# Patient Record
Sex: Male | Born: 1980 | Race: Black or African American | Hispanic: No | Marital: Single | Smoking: Former smoker
Health system: Southern US, Community
[De-identification: ages and names within clinical notes are randomized; demographics above are authoritative.]

## PROBLEM LIST (undated history)

## (undated) DIAGNOSIS — F419 Anxiety disorder, unspecified: Secondary | ICD-10-CM

## (undated) DIAGNOSIS — N183 Chronic kidney disease, stage 3 unspecified: Secondary | ICD-10-CM

## (undated) DIAGNOSIS — I639 Cerebral infarction, unspecified: Secondary | ICD-10-CM

## (undated) DIAGNOSIS — I517 Cardiomegaly: Secondary | ICD-10-CM

## (undated) DIAGNOSIS — F1210 Cannabis abuse, uncomplicated: Secondary | ICD-10-CM

## (undated) DIAGNOSIS — I10 Essential (primary) hypertension: Secondary | ICD-10-CM

## (undated) HISTORY — PX: NO PAST SURGERIES: SHX2092

---

## 2006-08-05 ENCOUNTER — Emergency Department: Payer: Self-pay | Admitting: Emergency Medicine
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
## 2013-04-02 ENCOUNTER — Emergency Department: Payer: Self-pay | Admitting: Emergency Medicine

## 2014-04-23 ENCOUNTER — Emergency Department: Payer: Self-pay | Admitting: Emergency Medicine

## 2014-04-24 LAB — BASIC METABOLIC PANEL
Anion Gap: 6 — ABNORMAL LOW (ref 7–16)
BUN: 14 mg/dL (ref 7–18)
CALCIUM: 9.5 mg/dL (ref 8.5–10.1)
CO2: 30 mmol/L (ref 21–32)
CREATININE: 1.36 mg/dL — AB (ref 0.60–1.30)
Chloride: 100 mmol/L (ref 98–107)
EGFR (African American): >60
EGFR (Non-African Amer.): >60
Glucose: 97 mg/dL (ref 65–99)
OSMOLALITY: 272 (ref 275–301)
Potassium: 3.8 mmol/L (ref 3.5–5.1)
Sodium: 136 mmol/L (ref 136–145)

## 2014-04-26 LAB — BETA STREP CULTURE(ARMC)

## 2015-01-05 ENCOUNTER — Emergency Department: Payer: Self-pay | Admitting: Student

## 2015-01-05 IMAGING — CR DG CHEST 2V
1 series · 2 of 2 positions shown · non-contrast
Comparison: [DATE]

CLINICAL DATA: Left-sided chest pain for 4 weeks, nonradiating

EXAM:
CHEST  2 VIEW

[Series 1: dxr chest pa (or ap) and lateral · 0.14mm/px · 2 of 2 slices shown]
[im 1/2]
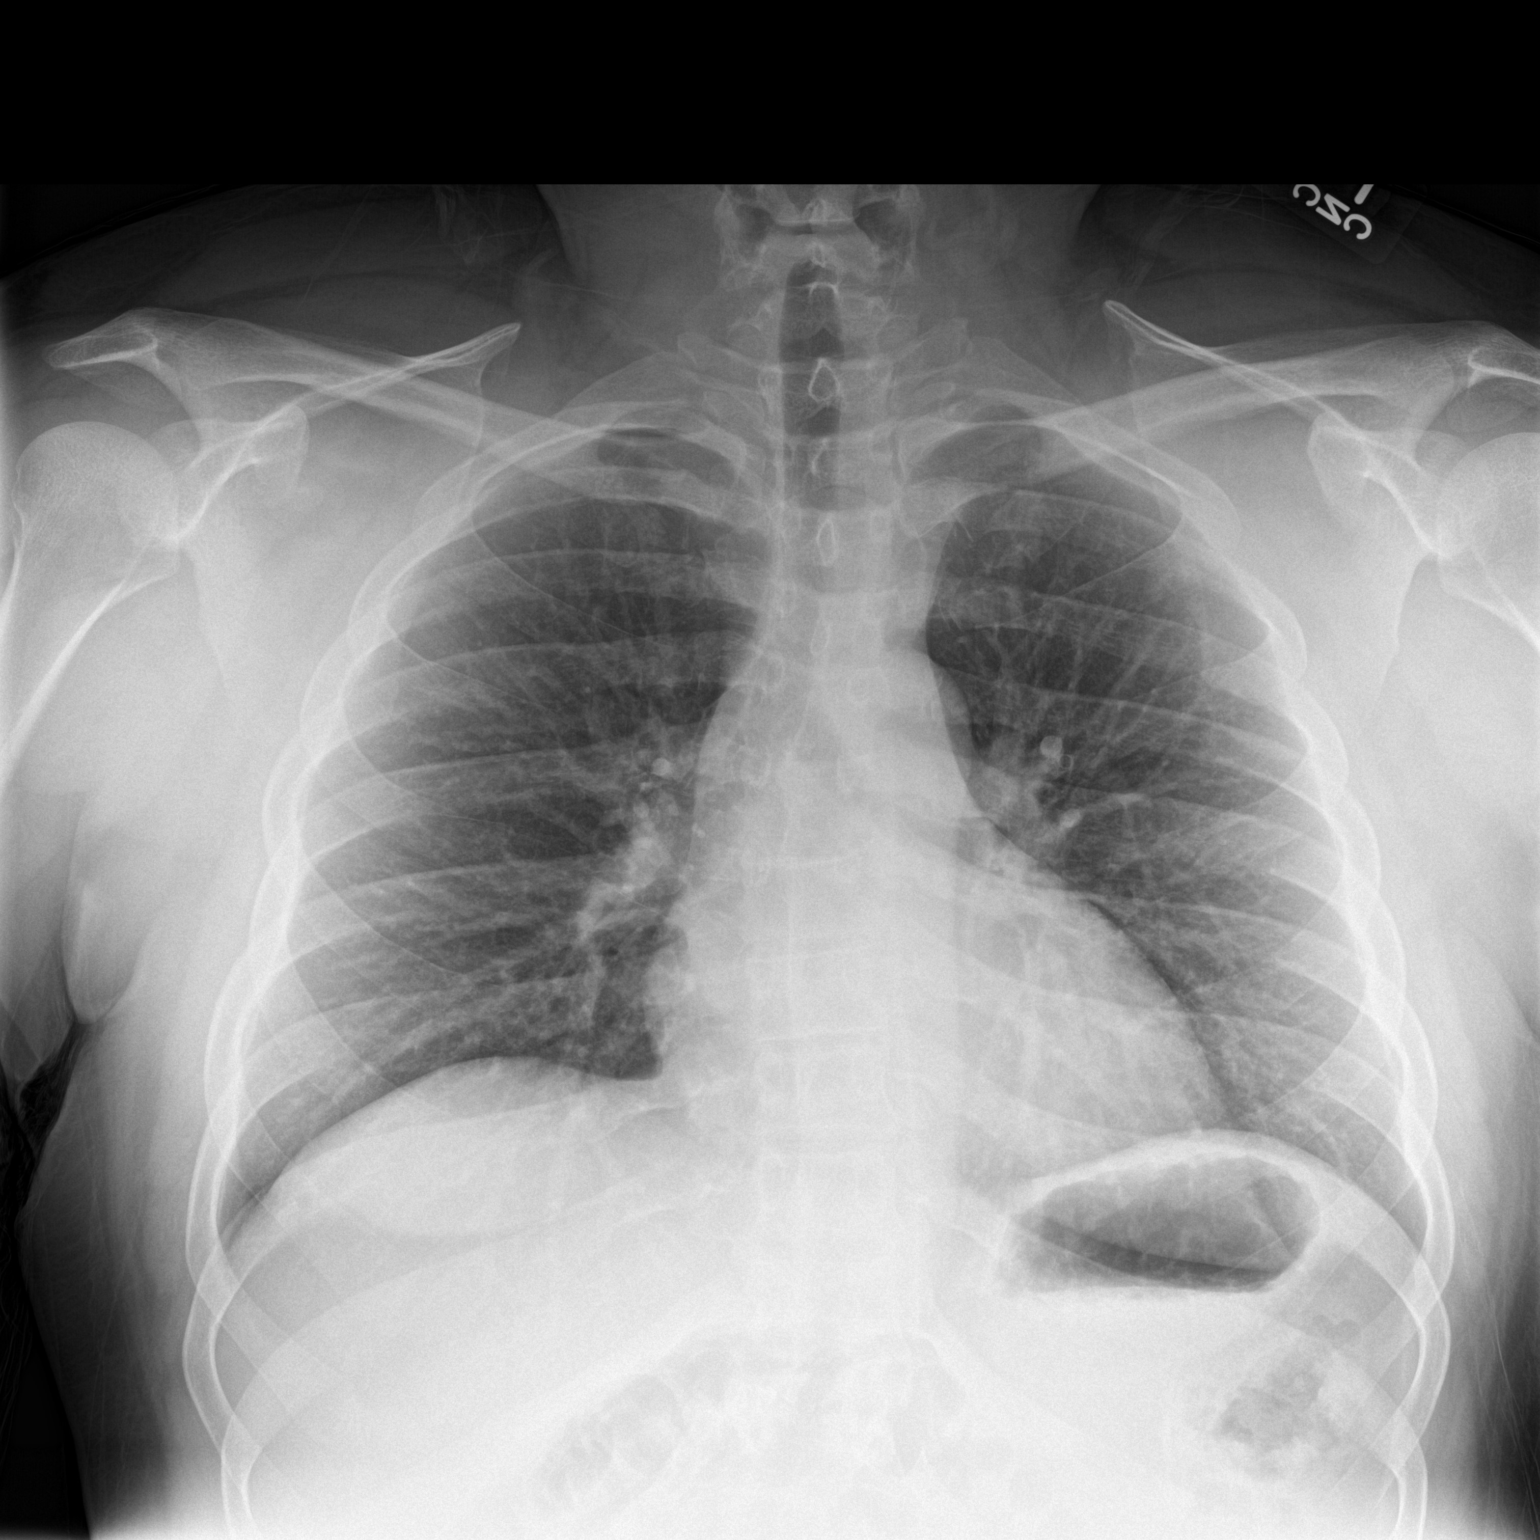
[im 2/2]
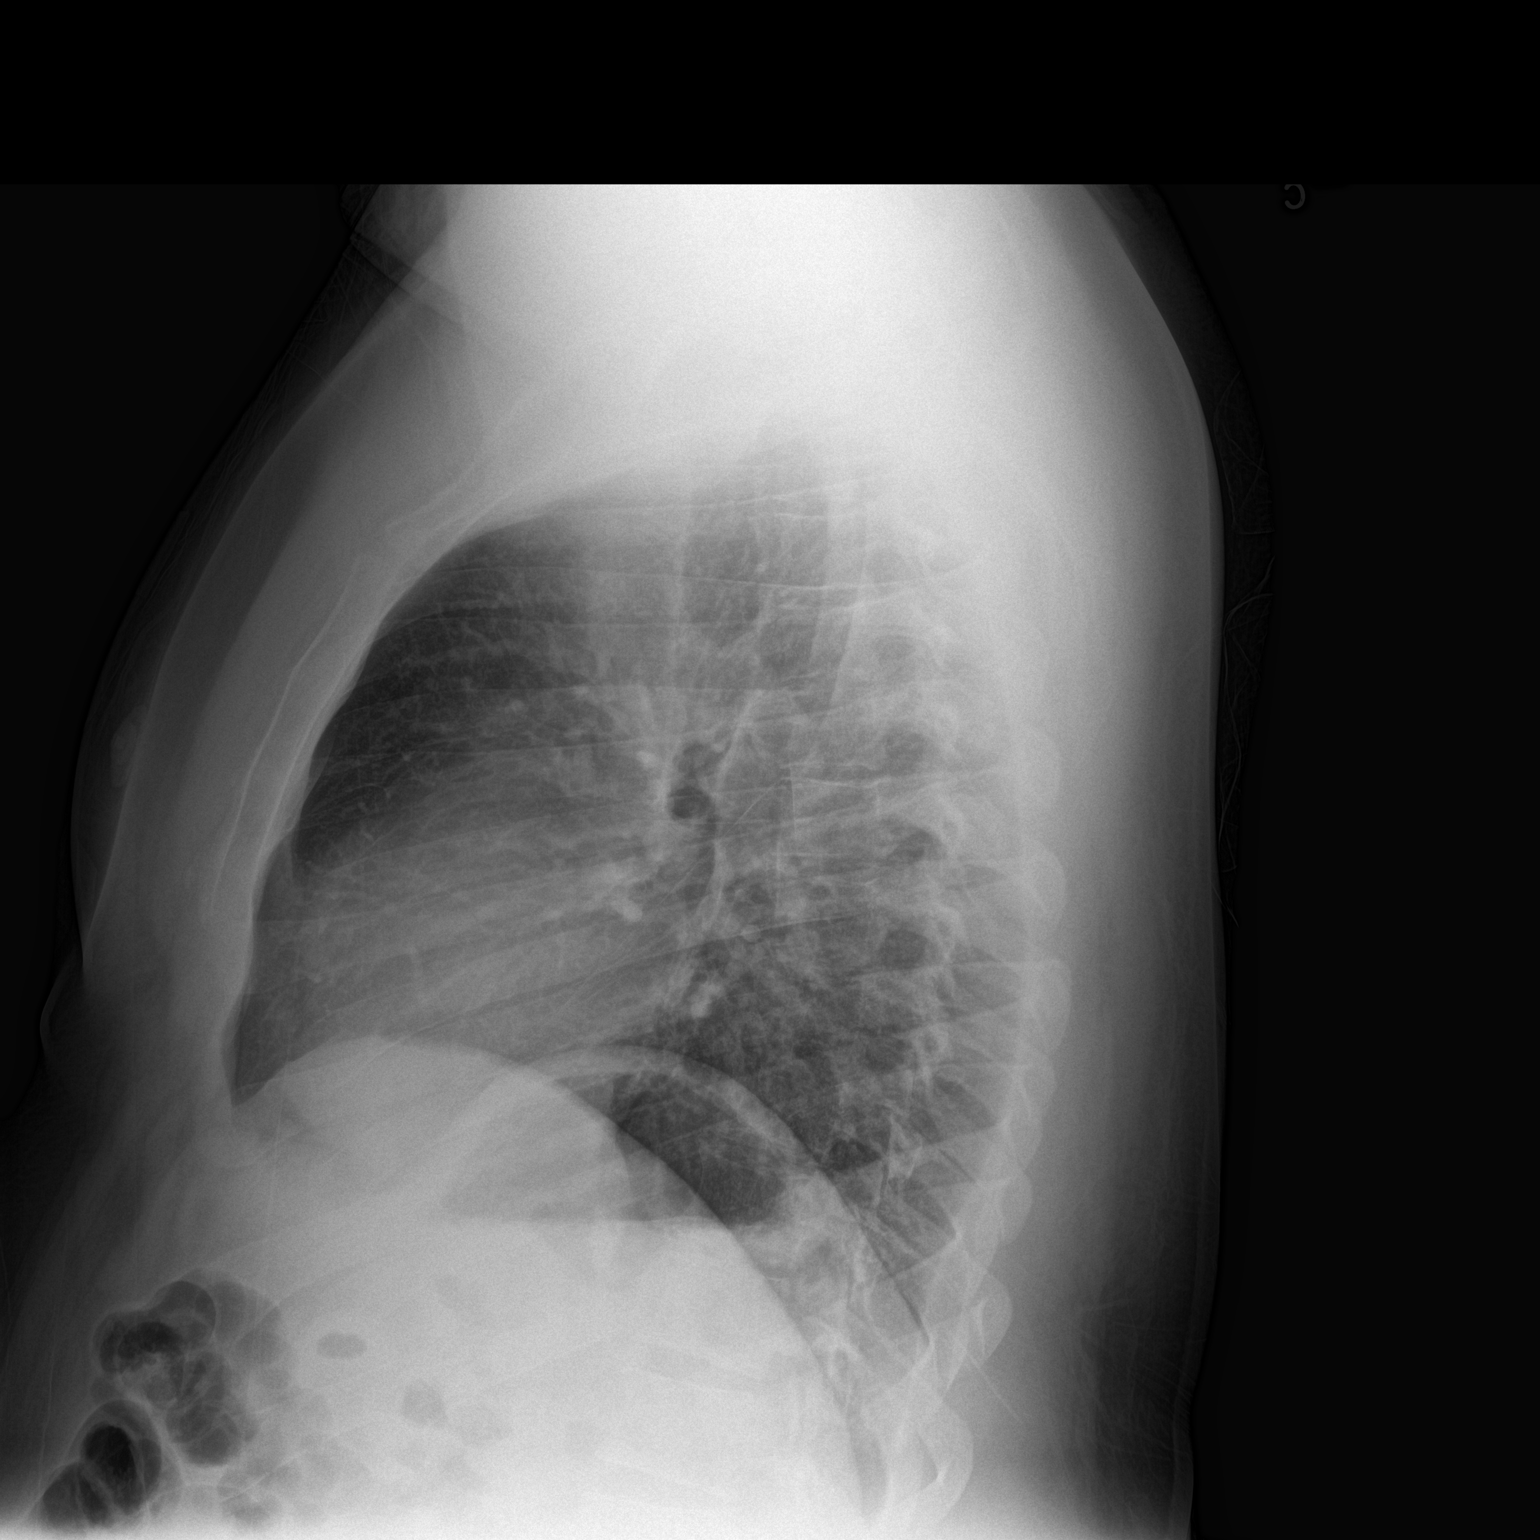

[2 of 2 positions shown; findings below may reference images not displayed]

FINDINGS: The lungs are clear. Heart size and pulmonary vascularity are
normal. No pneumothorax. No adenopathy. No bone lesions.
IMPRESSION: No edema or consolidation.

## 2016-08-26 ENCOUNTER — Emergency Department
Admission: EM | Admit: 2016-08-26 | Discharge: 2016-08-26 | Disposition: A | Discharge status: Home / Self Care | Payer: Self-pay | Attending: Emergency Medicine | Admitting: Emergency Medicine

## 2016-08-26 DIAGNOSIS — I10 Essential (primary) hypertension: Secondary | ICD-10-CM | POA: Insufficient documentation

## 2016-08-26 DIAGNOSIS — H16133 Photokeratitis, bilateral: Secondary | ICD-10-CM | POA: Insufficient documentation

## 2016-08-26 MED ORDER — ONDANSETRON 4 MG PO TBDP
4.0000 mg | ORAL_TABLET | Freq: Once | ORAL | Status: AC
  Administered 2016-08-26: 4 mg via ORAL
  Filled 2016-08-26: qty 1

## 2016-08-26 MED ORDER — OXYCODONE-ACETAMINOPHEN 5-325 MG PO TABS
1.0000 | ORAL_TABLET | Freq: Once | ORAL | Status: AC
  Administered 2016-08-26: 1 via ORAL
  Filled 2016-08-26: qty 1

## 2016-08-26 MED ORDER — TETRACAINE HCL 0.5 % OP SOLN
1.0000 [drp] | Freq: Once | OPHTHALMIC | Status: AC
  Administered 2016-08-26: 1 [drp] via OPHTHALMIC

## 2016-08-26 MED ORDER — HYDROCHLOROTHIAZIDE 50 MG PO TABS: 50.0000 mg | ORAL_TABLET | Freq: Every day | ORAL | 0 refills | Status: DC

## 2016-08-26 MED ORDER — IBUPROFEN 800 MG PO TABS
800.0000 mg | ORAL_TABLET | Freq: Once | ORAL | Status: AC
Start: 2016-08-26 — End: 2016-08-26
  Administered 2016-08-26: 800 mg via ORAL
  Filled 2016-08-26: qty 1

## 2016-08-26 MED ORDER — OXYCODONE-ACETAMINOPHEN 5-325 MG PO TABS: 1.0000 | ORAL_TABLET | ORAL | 0 refills | Status: DC | PRN

## 2016-08-26 MED ORDER — FLUORESCEIN SODIUM 1 MG OP STRP
ORAL_STRIP | OPHTHALMIC | Status: AC
  Administered 2016-08-26: 1 via OPHTHALMIC
  Filled 2016-08-26: qty 1

## 2016-08-26 MED ORDER — IBUPROFEN 800 MG PO TABS: 800.0000 mg | ORAL_TABLET | Freq: Three times a day (TID) | ORAL | 0 refills | Status: DC | PRN

## 2016-08-26 MED ORDER — TETRACAINE HCL 0.5 % OP SOLN
OPHTHALMIC | Status: AC
  Administered 2016-08-26: 1 [drp] via OPHTHALMIC
  Filled 2016-08-26: qty 2

## 2016-08-26 MED ORDER — TOBRAMYCIN 0.3 % OP SOLN
2.0000 [drp] | OPHTHALMIC | Status: DC
  Administered 2016-08-26: 2 [drp] via OPHTHALMIC
  Filled 2016-08-26: qty 5

## 2016-08-26 MED ORDER — TOBRAMYCIN 0.3 % OP SOLN: 2.0000 [drp] | OPHTHALMIC | 2 refills | Status: AC

## 2016-08-26 MED ORDER — FLUORESCEIN SODIUM 1 MG OP STRP
1.0000 | ORAL_STRIP | Freq: Once | OPHTHALMIC | Status: AC
  Administered 2016-08-26: 1 via OPHTHALMIC

## 2016-08-26 NOTE — ED Provider Notes (Signed)
[facility] Emergency Department Provider Note   ____________________________________________   First MD Initiated Contact with Patient 08/26/16 0222     (approximate)  I have reviewed the triage vital signs and the nursing notes.   HISTORY  Chief Complaint Burning Eyes    HPI Jeremy Hudson is a 35 y.o. male who presents to the ED from home with a chief complain of bilateral eyes burning and redness. 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 Normally does not wear corrective lenses. Awoke prior to arrival with irritation, redness and tearing to bilateral eyes. Elevated blood pressure at triage. States he is supposed to be on blood pressure medicines but he ran out several months ago. Denies associated fever, chills, chest pain, shortness of breath, abdominal pain, nausea, vomiting, diarrhea. Denies recent travel or trauma. Nothing makes his symptoms better or worse.   Past medical history Hypertension  There are no active problems to display for this patient.   No past surgical history on file.  Prior to Admission medications   Not on File    Allergies Review of patient's allergies indicates no known allergies.  No family history on file.  Social History Social History  Substance Use Topics  . Smoking status: Not on file  . Smokeless tobacco: Not on file  . Alcohol use Not on file  Denies EtOH  Review of Systems  Constitutional: No fever/chills. Eyes: Positive for bilateral eye irritation, redness and blurred vision. ENT: No sore throat. Cardiovascular: Denies chest pain. Respiratory: Denies shortness of breath. Gastrointestinal: No abdominal pain.  No nausea, no vomiting.  No diarrhea.  No constipation. Genitourinary: Negative for dysuria. Musculoskeletal: Negative for back pain. Skin: Negative for rash. Neurological: Negative for headaches, focal weakness or  numbness.  10-point ROS otherwise negative.  ____________________________________________   PHYSICAL EXAM:  VITAL SIGNS: ED Triage Vitals  Enc Vitals Group     BP 08/26/16 0143 (!) 252/159     Pulse Rate 08/26/16 0141 97     Resp 08/26/16 0141 18     Temp 08/26/16 0141 98.2 F (36.8 C)     Temp Source 08/26/16 0141 Oral     SpO2 08/26/16 0141 97 %     Weight 08/26/16 0142 230 lb (104.3 kg)     Height 08/26/16 0142 5\' 4"  (1.626 m)     Head Circumference --      Peak Flow --      Pain Score 08/26/16 0142 8     Pain Loc --      Pain Edu? --      Excl. in GC? --     Constitutional: Alert and oriented. Well appearing and in mild acute distress. Eyes: Conjunctivae are reddened bilaterally. PERRL. EOMI. visual acuity noted. Tetracaine applied to bilateral eyes. Fluorescein strip applied to both eyes and visualized under was lamp. There is a linear band across both corneas consistent with UV keratitis. Head: Atraumatic. Nose: No congestion/rhinnorhea. Mouth/Throat: Mucous membranes are moist.  Oropharynx non-erythematous. Neck: No stridor.   Cardiovascular: Normal rate, regular rhythm. Grossly normal heart sounds.  Good peripheral circulation. Respiratory: Normal respiratory effort.  No retractions. Lungs CTAB. Gastrointestinal: Soft and nontender. No distention. No abdominal bruits. No CVA tenderness. Musculoskeletal: No lower extremity tenderness nor edema.  No joint effusions. Neurologic:  Normal speech and language. No gross focal neurologic deficits are appreciated. No gait instability. Skin:  Skin is warm, dry and intact.  No rash noted. Psychiatric: Mood and affect are normal. Speech and behavior are normal.  ____________________________________________   LABS (all labs ordered are listed, but only abnormal results are displayed)  Labs Reviewed - No data to  display ____________________________________________  EKG  None ____________________________________________  RADIOLOGY  None ____________________________________________   PROCEDURES  Procedure(s) performed: None  Procedures  Critical Care performed: No  ____________________________________________   INITIAL IMPRESSION / ASSESSMENT AND PLAN / ED COURSE  Pertinent labs & imaging results that were available during my care of the patient were reviewed by me and considered in my medical decision making (see chart for details).  35 year old male who presents with UV keratitis to both eyes. Will administer analgesia, Tobrex eyedrops and patient will follow-up with ophthalmology this week. Will start patient on HCTZ for asymptomatic hypertension and patient will follow-up with PCP closely. Strict return precautions given. Both patient and mother verbalize understanding and agree with plan of care.  Clinical Course     ____________________________________________   FINAL CLINICAL IMPRESSION(S) / ED DIAGNOSES  Final diagnoses:  UV keratitis, bilateral  Essential hypertension      NEW MEDICATIONS STARTED DURING THIS VISIT:  New Prescriptions   No medications on file     Note:  This document was prepared using Dragon voice recognition software and may include unintentional dictation errors.    Irean HongJade J Sung, MD 08/26/16 [phone]

## 2016-08-26 NOTE — ED Notes (Signed)
Pt states woke up with eyes burning felt like eye wouldn't open. Pt noted to have redness states vision is blurry. Pt has elevated BP. Pt not taking BP like he is suppose to. Family at the bdside. Pt denies any cp or sob.

## 2016-08-26 NOTE — ED Notes (Signed)
Visua acuity performed on patient. Right eye 20/50 and left eye 20/40. MD notified.

## 2016-08-26 NOTE — ED Notes (Signed)
MD aware of bp

## 2016-08-26 NOTE — Discharge Instructions (Signed)
1. Apply Tobrex eyedrops 2 drops to each eye every 4 hours while awake 7 days. 2. You may take pain medicines as needed (Motrin/Percocet). 3. Start blood pressure medicine daily as prescribed (HCTZ 50 mg). 4. Return to the ER for worsening symptoms, persistent vomiting, difficulty breathing or other concerns.

## 2016-08-26 NOTE — ED Notes (Signed)
Discharge instructions reviewed with patient. Questions fielded by this RN. Patient verbalizes understanding of instructions. Patient discharged home in stable condition per Dolores FrameSung MD .  Dr Dolores FrameSung aware of BP beofre discharge. No acute distress noted at time of discharge.

## 2016-08-26 NOTE — ED Triage Notes (Signed)
Pt states he woke up with eyes burning and red. 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

## 2017-09-13 ENCOUNTER — Emergency Department
Admission: EM | Admit: 2017-09-13 | Discharge: 2017-09-13 | Disposition: A | Discharge status: Home / Self Care | Payer: Self-pay | Attending: Emergency Medicine | Admitting: Emergency Medicine

## 2017-09-13 ENCOUNTER — Emergency Department: Payer: Self-pay

## 2017-09-13 ENCOUNTER — Other Ambulatory Visit: Payer: Self-pay

## 2017-09-13 DIAGNOSIS — S0181XA Laceration without foreign body of other part of head, initial encounter: Secondary | ICD-10-CM | POA: Insufficient documentation

## 2017-09-13 DIAGNOSIS — Y929 Unspecified place or not applicable: Secondary | ICD-10-CM | POA: Insufficient documentation

## 2017-09-13 DIAGNOSIS — Y93E1 Activity, personal bathing and showering: Secondary | ICD-10-CM | POA: Insufficient documentation

## 2017-09-13 DIAGNOSIS — Z79899 Other long term (current) drug therapy: Secondary | ICD-10-CM | POA: Insufficient documentation

## 2017-09-13 DIAGNOSIS — R55 Syncope and collapse: Secondary | ICD-10-CM

## 2017-09-13 DIAGNOSIS — I10 Essential (primary) hypertension: Secondary | ICD-10-CM | POA: Insufficient documentation

## 2017-09-13 DIAGNOSIS — Y999 Unspecified external cause status: Secondary | ICD-10-CM | POA: Insufficient documentation

## 2017-09-13 DIAGNOSIS — W010XXA Fall on same level from slipping, tripping and stumbling without subsequent striking against object, initial encounter: Secondary | ICD-10-CM | POA: Insufficient documentation

## 2017-09-13 LAB — BASIC METABOLIC PANEL
ANION GAP: 10 (ref 5–15)
BUN: 15 mg/dL (ref 6–20)
CALCIUM: 9.2 mg/dL (ref 8.9–10.3)
CO2: 25 mmol/L (ref 22–32)
Chloride: 101 mmol/L (ref 101–111)
Creatinine, Ser: 1.41 mg/dL — ABNORMAL HIGH (ref 0.61–1.24)
Glucose, Bld: 126 mg/dL — ABNORMAL HIGH (ref 65–99)
Potassium: 3.6 mmol/L (ref 3.5–5.1)
SODIUM: 136 mmol/L (ref 135–145)

## 2017-09-13 LAB — CBC
HCT: 43.5 % (ref 40.0–52.0)
HEMOGLOBIN: 14 g/dL (ref 13.0–18.0)
MCH: 26.6 pg (ref 26.0–34.0)
MCHC: 32.3 g/dL (ref 32.0–36.0)
MCV: 82.3 fL (ref 80.0–100.0)
Platelets: 361 10*3/uL (ref 150–440)
RBC: 5.29 MIL/uL (ref 4.40–5.90)
RDW: 15 % — ABNORMAL HIGH (ref 11.5–14.5)
WBC: 11.5 10*3/uL — AB (ref 3.8–10.6)

## 2017-09-13 LAB — GLUCOSE, CAPILLARY: GLUCOSE-CAPILLARY: 120 mg/dL — AB (ref 65–99)

## 2017-09-13 MED ORDER — LIDOCAINE-EPINEPHRINE 1 %-1:100000 IJ SOLN
10.0000 mL | Freq: Once | INTRAMUSCULAR | Status: AC
  Administered 2017-09-13: 10 mL via INTRADERMAL
  Filled 2017-09-13: qty 10

## 2017-09-13 NOTE — Discharge Instructions (Signed)
Today I put 8 stitches in your right eyebrow that need to come out in 5-7 days.  Any doctor can do this including your primary care doctor, in urgent care, or of course we are more than happy to see you.  Please do not get any water on your wound for the first 24 hours but after that keep it clean and dry with warm soapy water in the shower.  Return to the emergency department for any concerns whatsoever.  It was a pleasure to take care of you today, and thank you for coming to our emergency department.  If you have any questions or concerns before leaving please ask the nurse to grab me and I'm more than happy to go through your aftercare instructions again.  If you were prescribed any opioid pain medication today such as Norco, Vicodin, Percocet, morphine, hydrocodone, or oxycodone please make sure you do not drive when you are taking this medication as it can alter your ability to drive safely.  If you have any concerns once you are home that you are not improving or are in fact getting worse before you can make it to your follow-up appointment, please do not hesitate to call [phone] and come back for further evaluation.  Merrily BrittleNeil Marcha Licklider, MD  Results for orders placed or performed during the hospital encounter of 09/13/17  Basic metabolic panel  Result Value Ref Range   Sodium 136 135 - 145 mmol/L   Potassium 3.6 3.5 - 5.1 mmol/L   Chloride 101 101 - 111 mmol/L   CO2 25 22 - 32 mmol/L   Glucose, Bld 126 (H) 65 - 99 mg/dL   BUN 15 6 - 20 mg/dL   Creatinine, Ser 0.451.41 (H) 0.61 - 1.24 mg/dL   Calcium 9.2 8.9 - 40.910.3 mg/dL   GFR calc non Af Amer >60 >60 mL/min   GFR calc Af Amer >60 >60 mL/min   Anion gap 10 5 - 15  CBC  Result Value Ref Range   WBC 11.5 (H) 3.8 - 10.6 K/uL   RBC 5.29 4.40 - 5.90 MIL/uL   Hemoglobin 14.0 13.0 - 18.0 g/dL   HCT 81.143.5 91.440.0 - 78.252.0 %   MCV 82.3 80.0 - 100.0 fL   MCH 26.6 26.0 - 34.0 pg   MCHC 32.3 32.0 - 36.0 g/dL   RDW 95.615.0 (H) 21.311.5 - 08.614.5 %   Platelets 361 150  - 440 K/uL  Glucose, capillary  Result Value Ref Range   Glucose-Capillary 120 (H) 65 - 99 mg/dL   No results found.

## 2017-09-13 NOTE — ED Provider Notes (Signed)
[facility] Emergency Department Provider Note  ____________________________________________   First MD Initiated Contact with Patient 09/13/17 1322     (approximate)  I have reviewed the triage vital signs and the nursing notes.   HISTORY  Chief Complaint Loss of Consciousness   HPI Jeremy Hudson is a 36 y.o. male who self presents to the emergency department with right facial trauma following a syncopal event.  He said that he was taking a hot shower this morning and he began to masturbate.  When he had an orgasm he felt lightheaded and the next thing he knew he woke up on the ground bleeding from his face.  His tetanus is up-to-date.  He has one previous syncopal episode in his life following a sexual experience.  He denies antecedent chest pain, palpitations, shortness of breath.  Symptoms began suddenly quickly.  They are worsened with orgasm and improved at rest.  No past medical history on file.  There are no active problems to display for this patient.   No past surgical history on file.  Prior to Admission medications   Medication Sig Start Date End Date Taking? Authorizing Provider  hydrochlorothiazide (HYDRODIURIL) 50 MG tablet Take 1 tablet (50 mg total) by mouth daily. 08/26/16   Irean HongSung, Jade J, MD  ibuprofen (ADVIL,MOTRIN) 800 MG tablet Take 1 tablet (800 mg total) by mouth every 8 (eight) hours as needed for moderate pain. 08/26/16   Irean HongSung, Jade J, MD  oxyCODONE-acetaminophen (ROXICET) 5-325 MG tablet Take 1 tablet by mouth every 4 (four) hours as needed for severe pain. 08/26/16   Irean HongSung, Jade J, MD    Allergies Patient has no known allergies.  No family history on file.  Social History Social History   Tobacco Use  . Smoking status: Not on file  Substance Use Topics  . Alcohol use: Not on file  . Drug use: Not on file    Review of Systems Constitutional: No fever/chills Eyes: No visual changes. ENT: No sore  throat. Cardiovascular: Denies chest pain. Respiratory: Denies shortness of breath. Gastrointestinal: No abdominal pain.  No nausea, no vomiting.  No diarrhea.  No constipation. Genitourinary: Negative for dysuria. Musculoskeletal: Negative for back pain. Skin: Positive for wound Neurological: Negative for headaches, focal weakness or numbness.   ____________________________________________   PHYSICAL EXAM:  VITAL SIGNS: ED Triage Vitals  Enc Vitals Group     BP 09/13/17 1250 (!) 217/121     Pulse Rate 09/13/17 1250 68     Resp 09/13/17 1250 18     Temp 09/13/17 1250 98.2 F (36.8 C)     Temp Source 09/13/17 1250 Oral     SpO2 09/13/17 1250 100 %     Weight 09/13/17 1250 213 lb (96.6 kg)     Height 09/13/17 1250 5\' 6"  (1.676 m)     Head Circumference --      Peak Flow --      Pain Score 09/13/17 1249 8     Pain Loc --      Pain Edu? --      Excl. in GC? --     Constitutional: Alert and oriented x4 well-appearing nontoxic no diaphoresis speaks in full clear sentences Eyes: PERRL EOMI. Head: 6 cm laceration to right brow. Nose: No congestion/rhinnorhea. Mouth/Throat: No trismus Neck: No stridor.   Cardiovascular: Normal rate, regular rhythm. Grossly normal heart sounds.  Good peripheral circulation. Respiratory: Normal respiratory effort.  No retractions. Lungs CTAB and moving good air  Gastrointestinal: Soft nontender Musculoskeletal: No lower extremity edema   Neurologic:  Normal speech and language. No gross focal neurologic deficits are appreciated. Skin: 6 cm laceration to right brow quite deep Psychiatric: Mood and affect are normal. Speech and behavior are normal.    ____________________________________________   DIFFERENTIAL includes but not limited to  Vasovagal syncope, cardiogenic syncope, laceration, intracerebral hemorrhage ____________________________________________   LABS (all labs ordered are listed, but only abnormal results are  displayed)  Labs Reviewed  BASIC METABOLIC PANEL - Abnormal; Notable for the following components:      Result Value   Glucose, Bld 126 (*)    Creatinine, Ser 1.41 (*)    All other components within normal limits  CBC - Abnormal; Notable for the following components:   WBC 11.5 (*)    RDW 15.0 (*)    All other components within normal limits  GLUCOSE, CAPILLARY - Abnormal; Notable for the following components:   Glucose-Capillary 120 (*)    All other components within normal limits  URINALYSIS, COMPLETE (UACMP) WITH MICROSCOPIC  CBG MONITORING, ED    Blood work reviewed and interpreted by me shows elevated white counts nonspecific and likely secondary to stress.  Slightly elevated creatinine consistent with chronic kidney disease __________________________________________  EKG  ED ECG REPORT I, Merrily BrittleNeil Pranay Hilbun, the attending physician, personally viewed and interpreted this ECG.  Date: 09/13/2017 EKG Time:  Rate: 69 Rhythm: normal sinus rhythm QRS Axis: normal Intervals: normal ST/T Wave abnormalities: normal Narrative Interpretation: no evidence of acute ischemia.  Large voltage is slightly poor R wave progression __________________________________________  RADIOLOGY   ____________________________________________   PROCEDURES  Procedure(s) performed: yes  LACERATION REPAIR Performed by: Merrily BrittleNeil Couper Juncaj Authorized by: Merrily BrittleNeil Torre [place] Consent: Verbal consent obtained. Risks and benefits: risks, benefits and alternatives were discussed Consent given by: patient Patient identity confirmed: provided demographic data Prepped and Draped in normal sterile fashion Wound explored  Laceration Location: Right eyebrow  Laceration Length: 6 cm  No Foreign Bodies seen or palpated  Anesthesia: local infiltration  Local anesthetic: lidocaine 1 % with epinephrine  Anesthetic total: 4 ml  Irrigation method: syringe Amount of cleaning: standard  Skin closure: Skin  closed in 2 layers First layer a total of 4 5-0 Vicryl sutures Superficial layer a total of 8 6-0 proline sutures  Number of sutures: 12 as above  Technique: Simple interrupted in 2 layers  Patient tolerance: Patient tolerated the procedure well with no immediate complications.  The patient's wound was anesthetized and then irrigated with 500 cc of normal saline suture.  No foreign bodies identified.   Procedures  Critical Care performed: no  Observation: no ____________________________________________   INITIAL IMPRESSION / ASSESSMENT AND PLAN / ED COURSE  Pertinent labs & imaging results that were available during my care of the patient were reviewed by me and considered in my medical decision making (see chart for details).  The patient arrives after a syncopal episode in the shower after masturbating.  Low suspicion for cardiogenic syncope.  His wound was irrigated and repaired in 2 layers with good cosmesis.  Strict return precautions have been given and patient verbalized understanding and agreement with the plan.  I appreciate the patient's elevated blood pressure but he already has blood pressure medications and a primary care physician.  He reports noncompliance with his medications because he does not like with him he can feel.  I expressed to him the importance of keeping his blood pressure under control and he verbalized understanding.  ____________________________________________   FINAL CLINICAL IMPRESSION(S) / ED DIAGNOSES  Final diagnoses:  Facial laceration, initial encounter  Vasovagal syncope  Hypertension, unspecified type      NEW MEDICATIONS STARTED DURING THIS VISIT:  This SmartLink is deprecated. Use AVSMEDLIST instead to display the medication list for a patient.   Note:  This document was prepared using Dragon voice recognition software and may include unintentional dictation errors.     Merrily Brittle, MD 09/13/17 [phone]

## 2017-09-13 NOTE — ED Notes (Signed)
Pt discharged to home.  Family member driving.  Discharge instructions reviewed.  Verbalized understanding.  No questions or concerns at this time.  Teach back verified.  Pt in NAD.  No items left in ED.   

## 2017-09-13 NOTE — ED Notes (Signed)
EDP aware of BP, okay for pt to d/c and f/u with PCP.

## 2017-09-13 NOTE — ED Triage Notes (Signed)
Pt states he was getting out of the shower and slipped and fell. Pt states he felt dizzy prior to coming out of the shower.  Pt states he did have LOC.  Pt has laceration on eye brow. Bleeding is controlled at this time.  Pt denies any dizziness, nausea or vomiting.
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
## 2018-02-21 ENCOUNTER — Inpatient Hospital Stay
Admission: EM | Admit: 2018-02-21 | Discharge: 2018-02-23 | DRG: 305 | Disposition: A | Discharge status: Home / Self Care | Payer: Self-pay | Attending: Internal Medicine | Admitting: Internal Medicine

## 2018-02-21 ENCOUNTER — Encounter: Payer: Self-pay | Admitting: Emergency Medicine

## 2018-02-21 ENCOUNTER — Other Ambulatory Visit: Payer: Self-pay

## 2018-02-21 ENCOUNTER — Emergency Department: Payer: Self-pay

## 2018-02-21 DIAGNOSIS — N179 Acute kidney failure, unspecified: Secondary | ICD-10-CM | POA: Diagnosis present

## 2018-02-21 DIAGNOSIS — I129 Hypertensive chronic kidney disease with stage 1 through stage 4 chronic kidney disease, or unspecified chronic kidney disease: Secondary | ICD-10-CM | POA: Diagnosis present

## 2018-02-21 DIAGNOSIS — Z79899 Other long term (current) drug therapy: Secondary | ICD-10-CM

## 2018-02-21 DIAGNOSIS — D72829 Elevated white blood cell count, unspecified: Secondary | ICD-10-CM | POA: Diagnosis present

## 2018-02-21 DIAGNOSIS — Z87891 Personal history of nicotine dependence: Secondary | ICD-10-CM

## 2018-02-21 DIAGNOSIS — I161 Hypertensive emergency: Principal | ICD-10-CM | POA: Diagnosis present

## 2018-02-21 DIAGNOSIS — N201 Calculus of ureter: Secondary | ICD-10-CM

## 2018-02-21 DIAGNOSIS — N132 Hydronephrosis with renal and ureteral calculous obstruction: Secondary | ICD-10-CM | POA: Diagnosis present

## 2018-02-21 DIAGNOSIS — N200 Calculus of kidney: Secondary | ICD-10-CM

## 2018-02-21 DIAGNOSIS — N183 Chronic kidney disease, stage 3 (moderate): Secondary | ICD-10-CM | POA: Diagnosis present

## 2018-02-21 DIAGNOSIS — I10 Essential (primary) hypertension: Secondary | ICD-10-CM | POA: Diagnosis present

## 2018-02-21 DIAGNOSIS — R112 Nausea with vomiting, unspecified: Secondary | ICD-10-CM | POA: Diagnosis present

## 2018-02-21 HISTORY — DX: Essential (primary) hypertension: I10

## 2018-02-21 LAB — LIPASE, BLOOD: LIPASE: 27 U/L (ref 11–51)

## 2018-02-21 LAB — COMPREHENSIVE METABOLIC PANEL
ALBUMIN: 4.9 g/dL (ref 3.5–5.0)
ALT: 46 U/L (ref 17–63)
AST: 27 U/L (ref 15–41)
Alkaline Phosphatase: 110 U/L (ref 38–126)
Anion gap: 10 (ref 5–15)
BILIRUBIN TOTAL: 1.1 mg/dL (ref 0.3–1.2)
BUN: 28 mg/dL — ABNORMAL HIGH (ref 6–20)
CO2: 25 mmol/L (ref 22–32)
Calcium: 9.3 mg/dL (ref 8.9–10.3)
Chloride: 101 mmol/L (ref 101–111)
Creatinine, Ser: 2.31 mg/dL — ABNORMAL HIGH (ref 0.61–1.24)
GFR calc Af Amer: 41 mL/min — ABNORMAL LOW (ref >60)
GFR, EST NON AFRICAN AMERICAN: 35 mL/min — AB (ref >60)
GLUCOSE: 120 mg/dL — AB (ref 65–99)
POTASSIUM: 3.7 mmol/L (ref 3.5–5.1)
Sodium: 136 mmol/L (ref 135–145)
TOTAL PROTEIN: 8.6 g/dL — AB (ref 6.5–8.1)

## 2018-02-21 LAB — URINALYSIS, COMPLETE (UACMP) WITH MICROSCOPIC
BILIRUBIN URINE: NEGATIVE
GLUCOSE, UA: NEGATIVE mg/dL
Ketones, ur: 20 mg/dL — AB
Leukocytes, UA: NEGATIVE
Nitrite: NEGATIVE
PH: 6 (ref 5.0–8.0)
Protein, ur: 100 mg/dL — AB
SPECIFIC GRAVITY, URINE: 1.018 (ref 1.005–1.030)

## 2018-02-21 LAB — CBC
HCT: 42.1 % (ref 40.0–52.0)
Hemoglobin: 13.7 g/dL (ref 13.0–18.0)
MCH: 27.5 pg (ref 26.0–34.0)
MCHC: 32.7 g/dL (ref 32.0–36.0)
MCV: 84.3 fL (ref 80.0–100.0)
PLATELETS: 354 10*3/uL (ref 150–440)
RBC: 4.99 MIL/uL (ref 4.40–5.90)
RDW: 15.3 % — AB (ref 11.5–14.5)
WBC: 17.4 10*3/uL — ABNORMAL HIGH (ref 3.8–10.6)

## 2018-02-21 LAB — TROPONIN I: Troponin I: <0.03 ng/mL (ref <0.03)

## 2018-02-21 IMAGING — CT CT ANGIO CHEST-ABD-PELV FOR DISSECTION W/ AND WO/W CM
2 of 7 series · 13 of 46 positions shown, 15 images · IV contrast (APPLIED)
Comparison: None.

CLINICAL DATA: Left-sided abdominal pain for several days, recent
episodes of vomiting with recent alcohol intake

EXAM:
CT ANGIOGRAPHY CHEST, ABDOMEN AND PELVIS
TECHNIQUE: Multidetector CT imaging through the chest, abdomen and pelvis was
performed using the standard protocol during bolus administration of
intravenous contrast. Multiplanar reconstructed images and MIPs were
obtained and reviewed to evaluate the vascular anatomy.
CONTRAST:  60mL OMNIPAQUE IOHEXOL 350 MG/ML SOLN

[Series 6: axial arterial · axial · arterial · 0.72mm/px · z∈[-710,-164]mm · 10 of 209 slices shown, 12 images]
[im 14/209  soft-tissue]
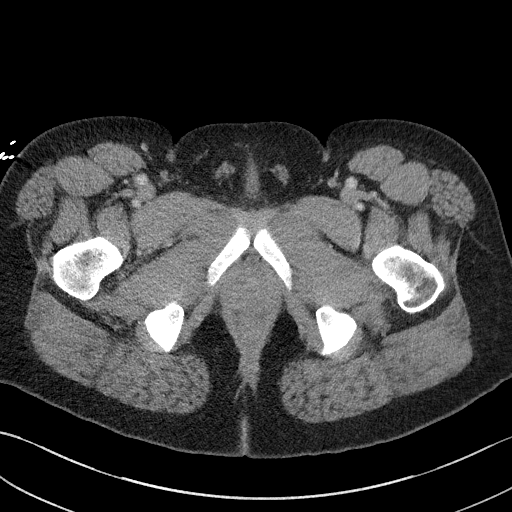
[im 14/209  bone]
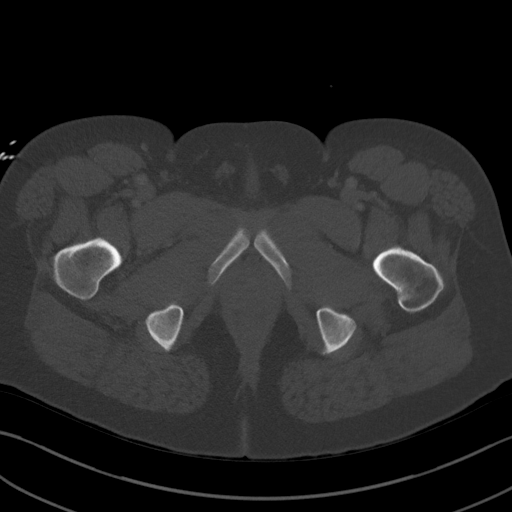
[im 40/209  soft-tissue]
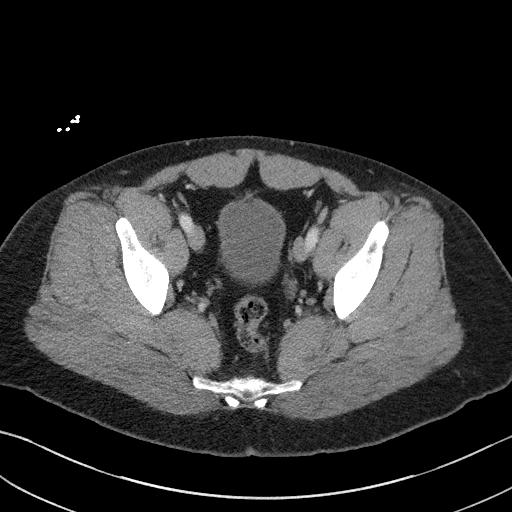
[im 53/209  soft-tissue]
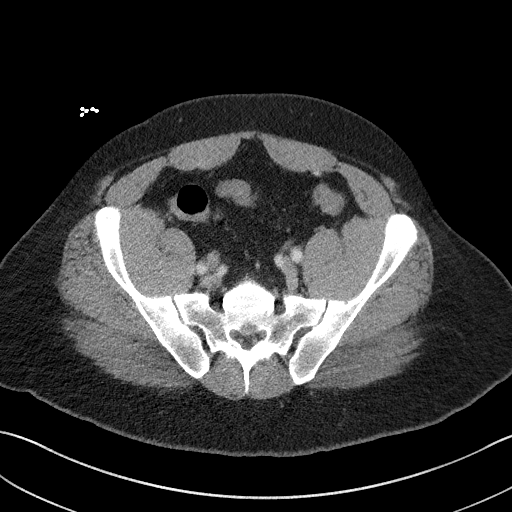
[im 79/209  soft-tissue]
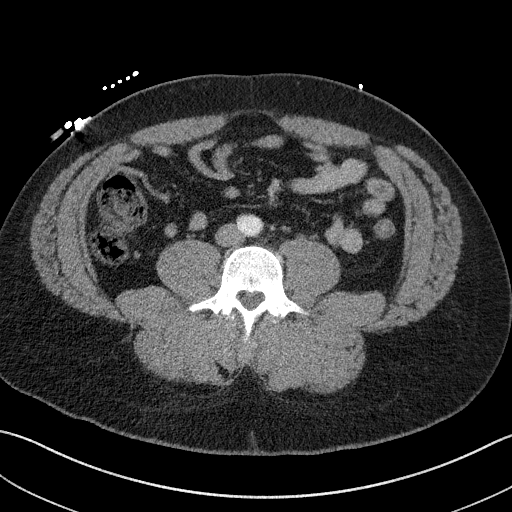
[im 92/209  soft-tissue]
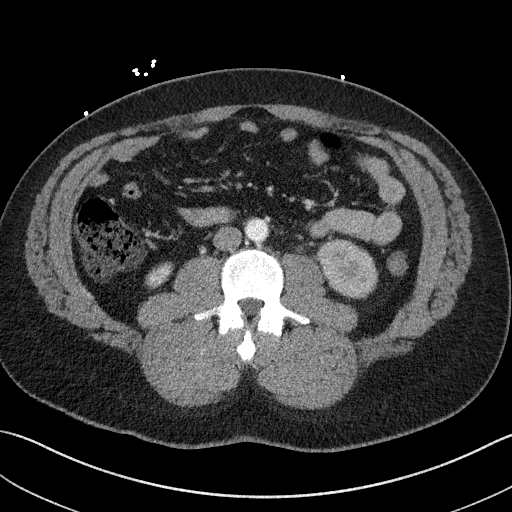
[im 118/209  soft-tissue]
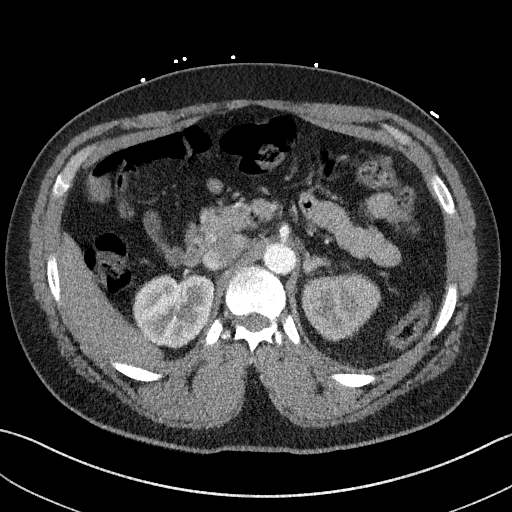
[im 131/209  soft-tissue]
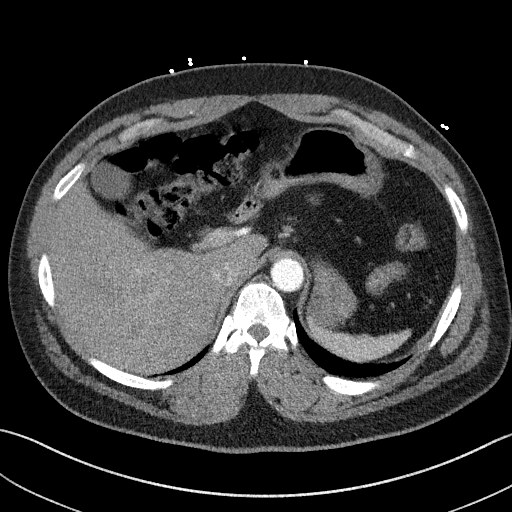
[im 157/209  soft-tissue]
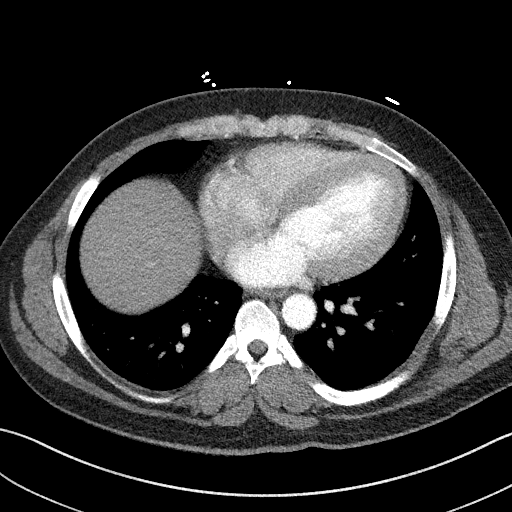
[im 170/209  soft-tissue]
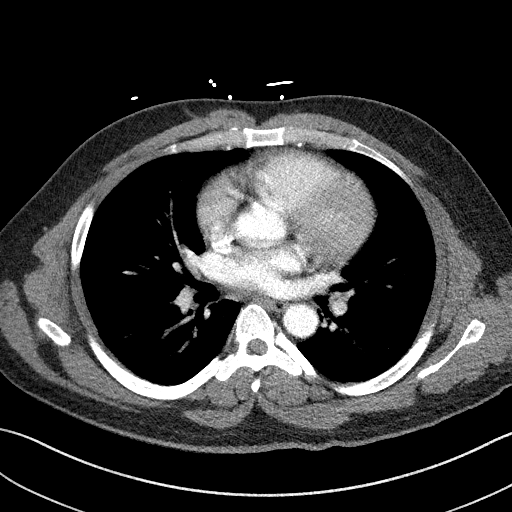
[im 170/209  bone]
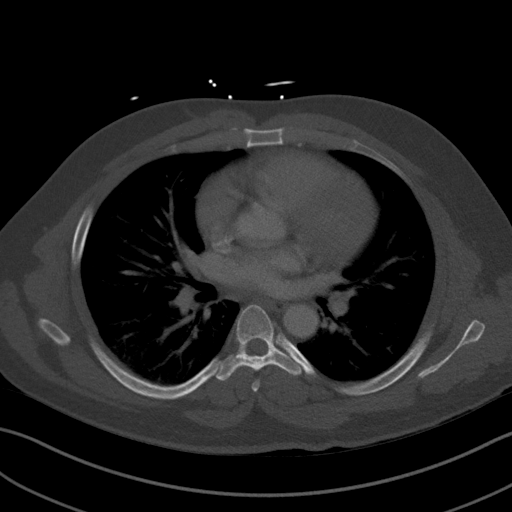
[im 196/209  soft-tissue]
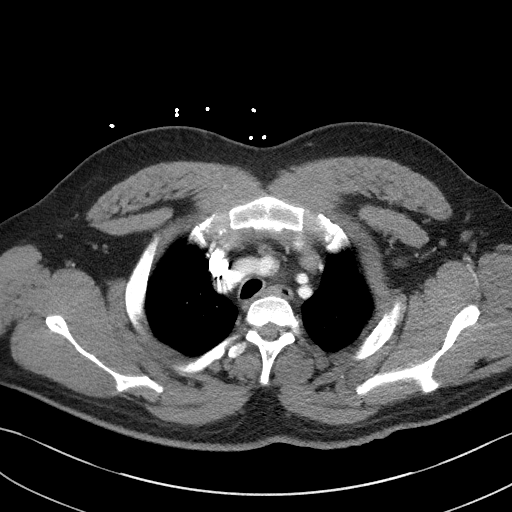

[Series 9: coronals · coronal · 0.74mm/px · 3 of 147 slices shown]
[im 37/147  soft-tissue]
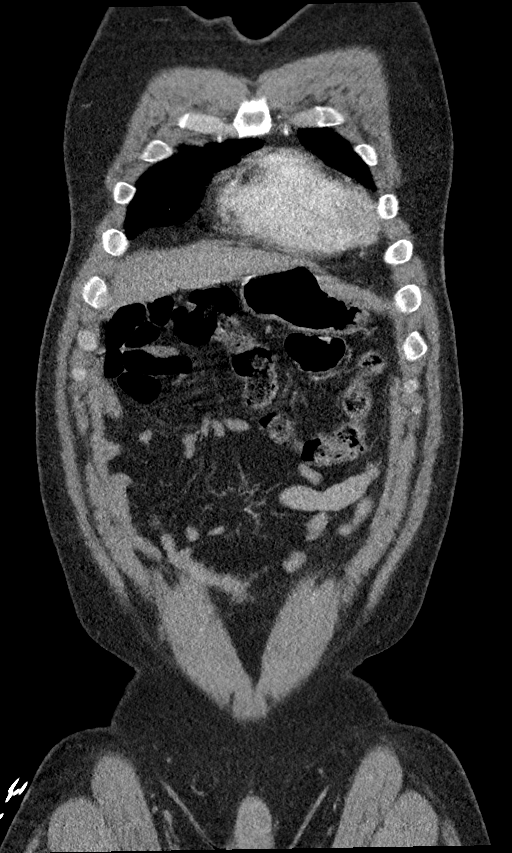
[im 74/147  soft-tissue]
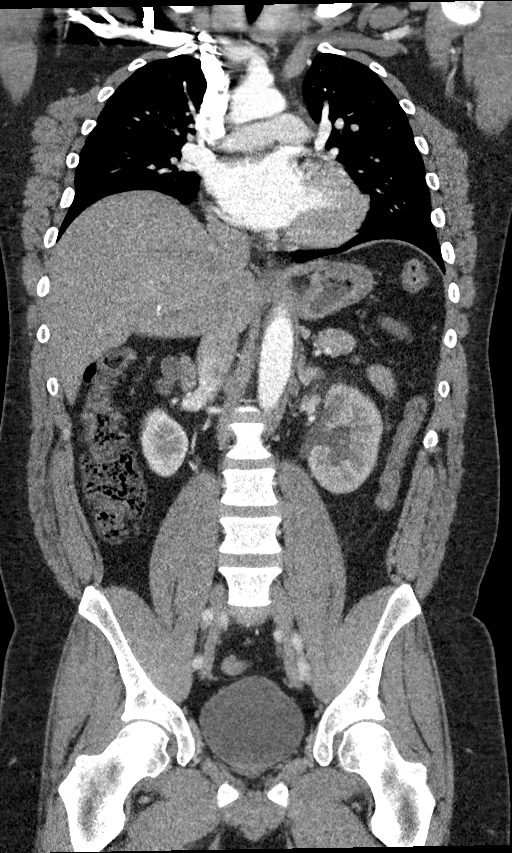
[im 110/147  soft-tissue]
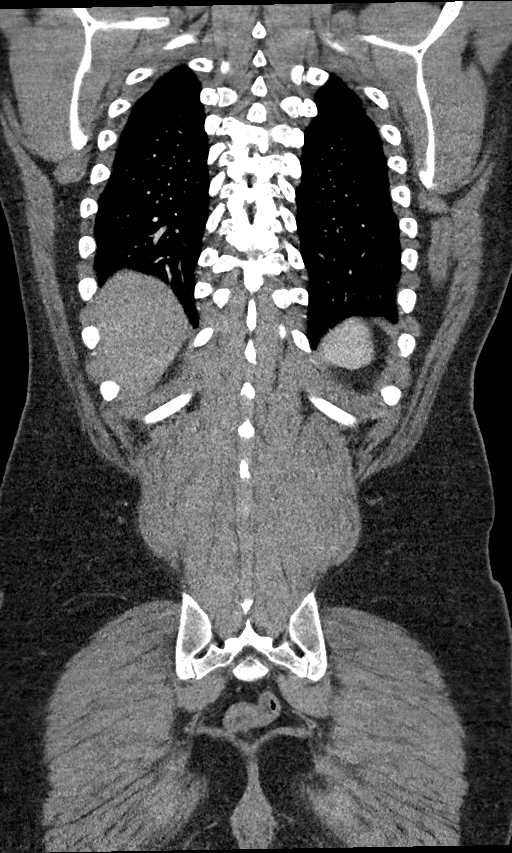

[13 of 46 positions shown; findings below may reference images not displayed]

FINDINGS: CTA CHEST FINDINGS

Cardiovascular: Thoracic aorta demonstrates a bovine branching
pattern. No aneurysmal dilatation or dissection is seen. The heart
is not significantly enlarged. No coronary calcifications are seen.
No pericardial effusion is noted. The pulmonary artery is poorly
evaluated although no large central embolus is seen.

Mediastinum/Nodes: Thoracic inlet is within normal limits. The
esophagus is well visualized and within normal limits. No hilar or
mediastinal adenopathy is noted.

Lungs/Pleura: Lungs are well aerated bilaterally without focal
infiltrate or sizable effusion. No parenchymal nodules are seen.

Musculoskeletal: No acute bony abnormality is noted.

Review of the MIP images confirms the above findings.

CTA ABDOMEN AND PELVIS FINDINGS

VASCULAR

Aorta: Normal caliber aorta without aneurysm, dissection, vasculitis
or significant stenosis.

Celiac: No significant atherosclerotic changes are noted. Some
downward depression likely related to median arcuate ligament
compression is seen.

SMA: Patent without evidence of aneurysm, dissection, vasculitis or
significant stenosis.

Renals: Single renal artery is noted on the right. Dual renal
arteries are noted on the left. No focal stenosis is noted.

IMA: Patent without evidence of aneurysm, dissection, vasculitis or
significant stenosis.

Iliacs: Patent without evidence of aneurysm, dissection, vasculitis
or significant stenosis.

Veins: No venous abnormality is noted.

Review of the MIP images confirms the above findings.

NON-VASCULAR

Hepatobiliary: Fatty infiltration of the liver is noted. The
gallbladder is well distended.

Pancreas: Unremarkable. No pancreatic ductal dilatation or
surrounding inflammatory changes.

Spleen: Normal in size without focal abnormality.

Adrenals/Urinary Tract: The adrenal glands are within normal limits
bilaterally. The right kidney is unremarkable. The left kidney
demonstrates decreased enhancement consistent with underlying
obstructive change. There is mild hydronephrosis identified
secondary to a 5 mm stone lodged at the left UVJ. The bladder is
well distended. No obstructive changes on the right are seen.

Stomach/Bowel: Stomach is within normal limits. Appendix appears
normal. No evidence of bowel wall thickening, distention, or
inflammatory changes.

Lymphatic: No significant lymphadenopathy is identified.

Reproductive: Prostate is unremarkable.

Other: No abdominal wall hernia or abnormality. No abdominopelvic
ascites.

Musculoskeletal: No acute or significant osseous findings.

Review of the MIP images confirms the above findings.
IMPRESSION: 5 mm left UVJ stone with mild hydronephrosis and decreased
enhancement of the left kidney secondary to the obstructive change.

Mild vascular variance as described above.

Fatty liver.

No other focal abnormality is noted.

## 2018-02-21 MED ORDER — ACETAMINOPHEN 650 MG RE SUPP: 650.0000 mg | Freq: Four times a day (QID) | RECTAL | Status: DC | PRN

## 2018-02-21 MED ORDER — ACETAMINOPHEN 325 MG PO TABS
650.0000 mg | ORAL_TABLET | Freq: Four times a day (QID) | ORAL | Status: DC | PRN
  Administered 2018-02-22: 650 mg via ORAL
  Filled 2018-02-21: qty 2

## 2018-02-21 MED ORDER — LABETALOL HCL 5 MG/ML IV SOLN
15.0000 mg | Freq: Once | INTRAVENOUS | Status: AC
  Administered 2018-02-21: 15 mg via INTRAVENOUS

## 2018-02-21 MED ORDER — OXYCODONE-ACETAMINOPHEN 5-325 MG PO TABS
1.0000 | ORAL_TABLET | ORAL | Status: DC | PRN
  Administered 2018-02-22: 1 via ORAL
  Filled 2018-02-21: qty 1

## 2018-02-21 MED ORDER — IOHEXOL 350 MG/ML SOLN
60.0000 mL | Freq: Once | INTRAVENOUS | Status: AC | PRN
  Administered 2018-02-21: 60 mL via INTRAVENOUS

## 2018-02-21 MED ORDER — HEPARIN SODIUM (PORCINE) 5000 UNIT/ML IJ SOLN
5000.0000 [IU] | Freq: Three times a day (TID) | INTRAMUSCULAR | Status: DC
  Administered 2018-02-22 – 2018-02-23 (×4): 5000 [IU] via SUBCUTANEOUS
  Filled 2018-02-21 (×4): qty 1

## 2018-02-21 MED ORDER — SODIUM CHLORIDE 0.9 % IV SOLN
INTRAVENOUS | Status: AC
  Administered 2018-02-21 – 2018-02-22 (×2): via INTRAVENOUS

## 2018-02-21 MED ORDER — PROMETHAZINE HCL 25 MG/ML IJ SOLN
12.5000 mg | Freq: Four times a day (QID) | INTRAMUSCULAR | Status: DC | PRN
  Administered 2018-02-22: 12.5 mg via INTRAVENOUS
  Filled 2018-02-21: qty 1

## 2018-02-21 MED ORDER — NITROGLYCERIN IN D5W 200-5 MCG/ML-% IV SOLN
0.0000 ug/min | Freq: Once | INTRAVENOUS | Status: AC
  Administered 2018-02-21: 5 ug/min via INTRAVENOUS
  Filled 2018-02-21: qty 250

## 2018-02-21 MED ORDER — SODIUM CHLORIDE 0.9 % IV BOLUS
1000.0000 mL | Freq: Once | INTRAVENOUS | Status: AC
  Administered 2018-02-21: 1000 mL via INTRAVENOUS

## 2018-02-21 MED ORDER — PROMETHAZINE HCL 25 MG/ML IJ SOLN
12.5000 mg | Freq: Four times a day (QID) | INTRAMUSCULAR | Status: DC | PRN
  Administered 2018-02-21: 12.5 mg via INTRAVENOUS
  Filled 2018-02-21: qty 1

## 2018-02-21 MED ORDER — ONDANSETRON HCL 4 MG/2ML IJ SOLN: 4.0000 mg | Freq: Four times a day (QID) | INTRAMUSCULAR | Status: DC | PRN

## 2018-02-21 MED ORDER — LABETALOL HCL 5 MG/ML IV SOLN
10.0000 mg | Freq: Once | INTRAVENOUS | Status: AC
  Administered 2018-02-21: 10 mg via INTRAVENOUS
  Filled 2018-02-21: qty 4

## 2018-02-21 MED ORDER — NITROGLYCERIN IN D5W 200-5 MCG/ML-% IV SOLN
0.0000 ug/min | INTRAVENOUS | Status: DC
  Administered 2018-02-21: 20 ug/min via INTRAVENOUS
  Administered 2018-02-21: 15 ug/min via INTRAVENOUS

## 2018-02-21 MED ORDER — ONDANSETRON HCL 4 MG PO TABS: 4.0000 mg | ORAL_TABLET | Freq: Four times a day (QID) | ORAL | Status: DC | PRN

## 2018-02-21 MED ORDER — MORPHINE SULFATE (PF) 2 MG/ML IV SOLN
2.0000 mg | INTRAVENOUS | Status: DC | PRN
  Administered 2018-02-21: 2 mg via INTRAVENOUS
  Filled 2018-02-21: qty 1

## 2018-02-21 MED ORDER — MORPHINE SULFATE (PF) 4 MG/ML IV SOLN
4.0000 mg | INTRAVENOUS | Status: DC | PRN
  Administered 2018-02-21: 4 mg via INTRAVENOUS
  Filled 2018-02-21 (×2): qty 1

## 2018-02-21 MED ORDER — LABETALOL HCL 5 MG/ML IV SOLN
20.0000 mg | Freq: Once | INTRAVENOUS | Status: DC
  Filled 2018-02-21: qty 4

## 2018-02-21 NOTE — ED Notes (Signed)
Patient transported to CT 

## 2018-02-21 NOTE — ED Provider Notes (Signed)
[facility] Emergency Department Provider Note    First MD Initiated Contact with Patient 02/21/18 Paulo Fruit     (approximate)  I have reviewed the triage vital signs and the nursing notes.   HISTORY  Chief Complaint Abdominal Pain    HPI MELIK BLANCETT is a 37 y.o. male history of hypertension presents with chief complaint of burning left-sided abdominal pain associated multiple episodes of vomiting 5-6 times per day.  States the pain started on Thursday night after the patient did endorse drinking some alcohol.  States that he is supposed to be on hydrochlorothiazide but has not been taking that because every time he takes he has been vomiting for the past several days.  Denies any history of known kidney disease.  Denies any shortness of breath.  Came to the ER today because the pain became worse and persisted.  Unable to tolerate any p.o.  Past Medical History:  Diagnosis Date  . Hypertension    History reviewed. No pertinent family history. History reviewed. No pertinent surgical history. There are no active problems to display for this patient.     Prior to Admission medications   Medication Sig Start Date End Date Taking? Authorizing Provider  hydrochlorothiazide (HYDRODIURIL) 50 MG tablet Take 1 tablet (50 mg total) by mouth daily. 08/26/16   Irean Hong, MD  ibuprofen (ADVIL,MOTRIN) 800 MG tablet Take 1 tablet (800 mg total) by mouth every 8 (eight) hours as needed for moderate pain. 08/26/16   Irean Hong, MD  oxyCODONE-acetaminophen (ROXICET) 5-325 MG tablet Take 1 tablet by mouth every 4 (four) hours as needed for severe pain. 08/26/16   Irean Hong, MD    Allergies Patient has no known allergies.    Social History Social History   Tobacco Use  . Smoking status: Former Games developer  . Smokeless tobacco: Never Used  Substance Use Topics  . Alcohol use: Yes  . Drug use: Yes    Types: Marijuana    Comment: Last use today    Review of  Systems Patient denies headaches, rhinorrhea, blurry vision, numbness, shortness of breath, chest pain, edema, cough, abdominal pain, nausea, vomiting, diarrhea, dysuria, fevers, rashes or hallucinations unless otherwise stated above in HPI. ____________________________________________   PHYSICAL EXAM:  VITAL SIGNS: Vitals:   02/21/18 2000 02/21/18 2030  BP: (!) 242/130 (!) 246/139  Pulse:  77  Resp:  (!) 26  Temp:    SpO2:  98%    Constitutional: Alert and oriented. Uncomfortable appearing but in no acute distress. Eyes: Conjunctivae are normal.  Head: Atraumatic. Nose: No congestion/rhinnorhea. Mouth/Throat: Mucous membranes are moist.   Neck: No stridor. Painless ROM.  Cardiovascular: Normal rate, regular rhythm. Grossly normal heart sounds.  Good peripheral circulation. Respiratory: Normal respiratory effort.  No retractions. Lungs CTAB. Gastrointestinal: Soft and nontender. No distention.  Genitourinary: deferred Musculoskeletal: No lower extremity tenderness nor edema.  No joint effusions. Neurologic:  Normal speech and language. No gross focal neurologic deficits are appreciated. No facial droop Skin:  Skin is warm, dry and intact. No rash noted. Psychiatric: Mood and affect are normal. Speech and behavior are normal.  ____________________________________________   LABS (all labs ordered are listed, but only abnormal results are displayed)  Results for orders placed or performed during the hospital encounter of 02/21/18 (from the past 24 hour(s))  Lipase, blood     Status: None   Collection Time: 02/21/18  6:12 PM  Result Value Ref Range   Lipase 27  11 - 51 U/L  Comprehensive metabolic panel     Status: Abnormal   Collection Time: 02/21/18  6:12 PM  Result Value Ref Range   Sodium 136 135 - 145 mmol/L   Potassium 3.7 3.5 - 5.1 mmol/L   Chloride 101 101 - 111 mmol/L   CO2 25 22 - 32 mmol/L   Glucose, Bld 120 (H) 65 - 99 mg/dL   BUN 28 (H) 6 - 20 mg/dL    Creatinine, Ser 1.612.31 (H) 0.61 - 1.24 mg/dL   Calcium 9.3 8.9 - 09.610.3 mg/dL   Total Protein 8.6 (H) 6.5 - 8.1 g/dL   Albumin 4.9 3.5 - 5.0 g/dL   AST 27 15 - 41 U/L   ALT 46 17 - 63 U/L   Alkaline Phosphatase 110 38 - 126 U/L   Total Bilirubin 1.1 0.3 - 1.2 mg/dL   GFR calc non Af Amer 35 (L) >60 mL/min   GFR calc Af Amer 41 (L) >60 mL/min   Anion gap 10 5 - 15  CBC     Status: Abnormal   Collection Time: 02/21/18  6:12 PM  Result Value Ref Range   WBC 17.4 (H) 3.8 - 10.6 K/uL   RBC 4.99 4.40 - 5.90 MIL/uL   Hemoglobin 13.7 13.0 - 18.0 g/dL   HCT 04.542.1 40.940.0 - 81.152.0 %   MCV 84.3 80.0 - 100.0 fL   MCH 27.5 26.0 - 34.0 pg   MCHC 32.7 32.0 - 36.0 g/dL   RDW 91.415.3 (H) 78.211.5 - 95.614.5 %   Platelets 354 150 - 440 K/uL  Urinalysis, Complete w Microscopic     Status: Abnormal   Collection Time: 02/21/18  6:12 PM  Result Value Ref Range   Color, Urine YELLOW (A) YELLOW   APPearance CLEAR (A) CLEAR   Specific Gravity, Urine 1.018 1.005 - 1.030   pH 6.0 5.0 - 8.0   Glucose, UA NEGATIVE NEGATIVE mg/dL   Hgb urine dipstick SMALL (A) NEGATIVE   Bilirubin Urine NEGATIVE NEGATIVE   Ketones, ur 20 (A) NEGATIVE mg/dL   Protein, ur 213100 (A) NEGATIVE mg/dL   Nitrite NEGATIVE NEGATIVE   Leukocytes, UA NEGATIVE NEGATIVE   RBC / HPF 6-30 0 - 5 RBC/hpf   WBC, UA 0-5 0 - 5 WBC/hpf   Squamous Epithelial / LPF 0-5 (A) NONE SEEN  Troponin I     Status: None   Collection Time: 02/21/18  6:12 PM  Result Value Ref Range   Troponin I <0.03 <0.03 ng/mL   ____________________________________________  EKG My review and personal interpretation at Time: 18:10   Indication: htn  Rate: 70  Rhythm: sinus Axis: normal Other: lvh criteria, no stemi ____________________________________________  RADIOLOGY  I personally reviewed all radiographic images ordered to evaluate for the above acute complaints and reviewed radiology reports and findings.  These findings were personally discussed with the patient.  Please see  medical record for radiology report.  ____________________________________________   PROCEDURES  Procedure(s) performed:  .Critical Care Performed by: Willy Eddyobinson, Evelise Reine, MD Authorized by: Willy Eddyobinson, Yara Tomkinson, MD   Critical care provider statement:    Critical care time (minutes):  30   Critical care time was exclusive of:  Separately billable procedures and treating other patients   Critical care was necessary to treat or prevent imminent or life-threatening deterioration of the following conditions: hypertensive emergency.   Critical care was time spent personally by me on the following activities:  Development of treatment plan with patient or surrogate, discussions  with consultants, evaluation of patient's response to treatment, examination of patient, obtaining history from patient or surrogate, ordering and performing treatments and interventions, ordering and review of laboratory studies, ordering and review of radiographic studies, pulse oximetry, re-evaluation of patient's condition and review of old charts      Critical Care performed: yes ____________________________________________   INITIAL IMPRESSION / ASSESSMENT AND PLAN / ED COURSE  Pertinent labs & imaging results that were available during my care of the patient were reviewed by me and considered in my medical decision making (see chart for details).  DDX: Dissection, AK I, electrolyte abnormality, pancreatitis, colitis, ACS, kidney stone, renal artery stenosis  MARIUSZ JUBB is a 37 y.o. who presents to the ED with flank pain nausea and vomiting as described above.  Lipase is normal.  Patient with profoundly elevated blood pressures.  Based on his pain CT angiogram ordered to evaluate for dissection which shows fortunately no evidence of aortic dissection or mesenteric ischemia.  Does show evidence of left-sided ureterolithiasis with hydronephrosis and decreased renal perfusion as a result of hydronephrosis.  Urine  shows no evidence of infection.  Patient is artery received several doses of IV pain medication and IV antihypertensives.  Will continue to observe and see if we can control his blood pressure.  Clinical Course as of Feb 21 2130  Sun Feb 21, 2018  2120 Patient with persistently elevated blood pressure despite 2 doses of labetalol.  Will start on nitro drip due to his persistent hypertension.  Patient's pain is currently controlled.  Much of this might be secondary to discomfort from kidney stone as there is evidence of changes secondary to hydronephrosis.   [PR]    Clinical Course User Index [PR] Willy Eddy, MD   Based on his persistent hypertension and symptoms persistent nausea and vomiting do feel patient will require hospitalization for additional blood pressure management given the fact that he is on an IV nitro drip.  Spoke with hospitalist who kindly agrees to admit patient to hospital.  Have discussed with the patient and available family all diagnostics and treatments performed thus far and all questions were answered to the best of my ability. The patient demonstrates understanding and agreement with plan.   As part of my medical decision making, I reviewed the following data within the electronic MEDICAL RECORD NUMBER Nursing notes reviewed and incorporated, Labs reviewed, notes from prior ED visits and  Controlled Substance Database   ____________________________________________   FINAL CLINICAL IMPRESSION(S) / ED DIAGNOSES  Final diagnoses:  Hypertensive emergency  Ureterolithiasis  AKI (acute kidney injury) (HCC)      NEW MEDICATIONS STARTED DURING THIS VISIT:  New Prescriptions   No medications on file     Note:  This document was prepared using Dragon voice recognition software and may include unintentional dictation errors.    Willy Eddy, MD 02/21/18 2131

## 2018-02-21 NOTE — ED Notes (Signed)
Pt admits to frequent, daily marijuana use that has decreased over the past three days. Pt also does not take all of the BP meds he is rx'd because he has run out of one and the other did not take due to vomiting.

## 2018-02-21 NOTE — ED Notes (Signed)
MD Roxan Hockeyobinson ordered to give labetalol and hold nitroglycerin drip at this time.

## 2018-02-21 NOTE — H&P (Signed)
[facility] [place] [facility]   PATIENT NAME: Jeremy Hudson    MR#:  [id number]  DATE OF BIRTH:  1981-07-10  DATE OF ADMISSION:  02/21/2018  PRIMARY CARE PHYSICIAN: Patient, No Pcp Per   REQUESTING/REFERRING PHYSICIAN: Roxan Hockey, MD  CHIEF COMPLAINT:   Chief Complaint  Patient presents with  . Abdominal Pain    HISTORY OF PRESENT ILLNESS:  [name]  is a 37 y.o. male who presents with abdominal/flank pain for the past 3 days.  Patient has had associated nausea and vomiting persistently.  Here in the ED tonight he was found to have kidney stone.  His blood pressure is also significantly elevated, and somewhat difficult to control.  Patient states he has not been able to keep down any of his antihypertensives at home due to his nausea and vomiting.  He also has some AKI and leukocytosis, but does not appear toxic at this time, and has no clearly identified infectious etiology.  Hospitalist called for admission  PAST MEDICAL HISTORY:   Past Medical History:  Diagnosis Date  . Hypertension      PAST SURGICAL HISTORY:   Past Surgical History:  Procedure Laterality Date  . NO PAST SURGERIES       SOCIAL HISTORY:   Social History   Tobacco Use  . Smoking status: Former Games developer  . Smokeless tobacco: Never Used  Substance Use Topics  . Alcohol use: Yes     FAMILY HISTORY:  Family history reviewed and is noncontributory   DRUG ALLERGIES:  No Known Allergies  MEDICATIONS AT HOME:   Prior to Admission medications   Medication Sig Start Date End Date Taking? Authorizing Provider  hydrochlorothiazide (HYDRODIURIL) 50 MG tablet Take 1 tablet (50 mg total) by mouth daily. 08/26/16   Irean Hong, MD  ibuprofen (ADVIL,MOTRIN) 800 MG tablet Take 1 tablet (800 mg total) by mouth every 8 (eight) hours as needed for moderate pain. 08/26/16   Irean Hong, MD  oxyCODONE-acetaminophen (ROXICET) 5-325 MG tablet Take 1 tablet by mouth every 4  (four) hours as needed for severe pain. 08/26/16   Irean Hong, MD    REVIEW OF SYSTEMS:  Review of Systems  Constitutional: Negative for chills, fever, malaise/fatigue and weight loss.  HENT: Negative for ear pain, hearing loss and tinnitus.   Eyes: Negative for blurred vision, double vision, pain and redness.  Respiratory: Negative for cough, hemoptysis and shortness of breath.   Cardiovascular: Negative for chest pain, palpitations, orthopnea and leg swelling.  Gastrointestinal: Negative for abdominal pain, constipation, diarrhea, nausea and vomiting.  Genitourinary: Positive for dysuria and flank pain. Negative for frequency and hematuria.  Musculoskeletal: Negative for back pain, joint pain and neck pain.  Skin:       No acne, rash, or lesions  Neurological: Negative for dizziness, tremors, focal weakness and weakness.  Endo/Heme/Allergies: Negative for polydipsia. Does not bruise/bleed easily.  Psychiatric/Behavioral: Negative for depression. The patient is not nervous/anxious and does not have insomnia.      VITAL SIGNS:   Vitals:   02/21/18 2045 02/21/18 2100 02/21/18 2115 02/21/18 2130  BP: (!) 244/147 (!) 227/127 (!) 217/126 (!) 209/118  Pulse: 93 80 87 78  Resp: (!) 22 (!) 26 (!) 28 (!) 24  Temp:      TempSrc:      SpO2: 99% 98% 97% 97%  Weight:      Height:       Wt Readings from Last 3 Encounters:  02/21/18 97.5 kg (215 lb)  09/13/17 96.6 kg (213 lb)  08/26/16 104.3 kg (230 lb)    PHYSICAL EXAMINATION:  Physical Exam  Vitals reviewed. Constitutional: He is oriented to person, place, and time. He appears well-developed and well-nourished. No distress.  HENT:  Head: Normocephalic and atraumatic.  Mouth/Throat: Oropharynx is clear and moist.  Eyes: Pupils are equal, round, and reactive to light. Conjunctivae and EOM are normal. No scleral icterus.  Neck: Normal range of motion. Neck supple. No JVD present. No thyromegaly present.  Cardiovascular: Normal  rate, regular rhythm and intact distal pulses. Exam reveals no gallop and no friction rub.  No murmur heard. Respiratory: Effort normal and breath sounds normal. No respiratory distress. He has no wheezes. He has no rales.  GI: Soft. Bowel sounds are normal. He exhibits no distension. There is tenderness.  Musculoskeletal: Normal range of motion. He exhibits no edema.  No arthritis, no gout  Lymphadenopathy:    He has no cervical adenopathy.  Neurological: He is alert and oriented to person, place, and time. No cranial nerve deficit.  No dysarthria, no aphasia  Skin: Skin is warm and dry. No rash noted. No erythema.  Psychiatric: He has a normal mood and affect. His behavior is normal. Judgment and thought content normal.    LABORATORY PANEL:   CBC Recent Labs  Lab 02/21/18 1812  WBC 17.4*  HGB 13.7  HCT 42.1  PLT 354   ------------------------------------------------------------------------------------------------------------------  Chemistries  Recent Labs  Lab 02/21/18 1812  NA 136  K 3.7  CL 101  CO2 25  GLUCOSE 120*  BUN 28*  CREATININE 2.31*  CALCIUM 9.3  AST 27  ALT 46  ALKPHOS 110  BILITOT 1.1   ------------------------------------------------------------------------------------------------------------------  Cardiac Enzymes Recent Labs  Lab 02/21/18 1812  TROPONINI <0.03   ------------------------------------------------------------------------------------------------------------------  RADIOLOGY:  Ct Angio Chest/abd/pel For Dissection W And/or Wo Contrast  Result Date: 02/21/2018 CLINICAL DATA:  Left-sided abdominal pain for several days, recent episodes of vomiting with recent alcohol intake EXAM: CT ANGIOGRAPHY CHEST, ABDOMEN AND PELVIS TECHNIQUE: Multidetector CT imaging through the chest, abdomen and pelvis was performed using the standard protocol during bolus administration of intravenous contrast. Multiplanar reconstructed images and MIPs were  obtained and reviewed to evaluate the vascular anatomy. CONTRAST:  60mL OMNIPAQUE IOHEXOL 350 MG/ML SOLN COMPARISON:  None. FINDINGS: CTA CHEST FINDINGS Cardiovascular: Thoracic aorta demonstrates a bovine branching pattern. No aneurysmal dilatation or dissection is seen. The heart is not significantly enlarged. No coronary calcifications are seen. No pericardial effusion is noted. The pulmonary artery is poorly evaluated although no large central embolus is seen. Mediastinum/Nodes: Thoracic inlet is within normal limits. The esophagus is well visualized and within normal limits. No hilar or mediastinal adenopathy is noted. Lungs/Pleura: Lungs are well aerated bilaterally without focal infiltrate or sizable effusion. No parenchymal nodules are seen. Musculoskeletal: No acute bony abnormality is noted. Review of the MIP images confirms the above findings. CTA ABDOMEN AND PELVIS FINDINGS VASCULAR Aorta: Normal caliber aorta without aneurysm, dissection, vasculitis or significant stenosis. Celiac: No significant atherosclerotic changes are noted. Some downward depression likely related to median arcuate ligament compression is seen. SMA: Patent without evidence of aneurysm, dissection, vasculitis or significant stenosis. Renals: Single renal artery is noted on the right. Dual renal arteries are noted on the left. No focal stenosis is noted. IMA: Patent without evidence of aneurysm, dissection, vasculitis or significant stenosis. Iliacs: Patent without evidence of aneurysm, dissection, vasculitis or significant stenosis. Veins: No venous abnormality  is noted. Review of the MIP images confirms the above findings. NON-VASCULAR Hepatobiliary: Fatty infiltration of the liver is noted. The gallbladder is well distended. Pancreas: Unremarkable. No pancreatic ductal dilatation or surrounding inflammatory changes. Spleen: Normal in size without focal abnormality. Adrenals/Urinary Tract: The adrenal glands are within normal  limits bilaterally. The right kidney is unremarkable. The left kidney demonstrates decreased enhancement consistent with underlying obstructive change. There is mild hydronephrosis identified secondary to a 5 mm stone lodged at the left UVJ. The bladder is well distended. No obstructive changes on the right are seen. Stomach/Bowel: Stomach is within normal limits. Appendix appears normal. No evidence of bowel wall thickening, distention, or inflammatory changes. Lymphatic: No significant lymphadenopathy is identified. Reproductive: Prostate is unremarkable. Other: No abdominal wall hernia or abnormality. No abdominopelvic ascites. Musculoskeletal: No acute or significant osseous findings. Review of the MIP images confirms the above findings. IMPRESSION: 5 mm left UVJ stone with mild hydronephrosis and decreased enhancement of the left kidney secondary to the obstructive change. Mild vascular variance as described above. Fatty liver. No other focal abnormality is noted. Electronically Signed   By: Alcide CleverMark  Lukens M.D.   On: 02/21/2018 20:42    EKG:   Orders placed or performed during the hospital encounter of 02/21/18  . EKG 12-Lead  . EKG 12-Lead  . ED EKG  . ED EKG    IMPRESSION AND PLAN:  Principal Problem:   Accelerated hypertension -patient was given several doses of IV antihypertensives in the ED without much success in controlling his blood pressure.  He was then started on a nitro drip.  We will continue this drip tonight to control his blood pressure and reinitiate his p.o. meds once his nausea and vomiting is under control.  Blood pressure goal less than 180/100 tonight, to be further titrated down in the morning to goal less than 160/100. Active Problems:   Kidney stone -5 mm left renal stone seen at the UVJ.  We will hydrate him with fluids tonight to assist in completion of passage of the stone.  PRN analgesia   AKI (acute kidney injury) (HCC) -with some hydronephrosis on the left side seen  on imaging, due to his kidney stone as above, IV fluids tonight and monitor for expected improvement, avoid nephrotoxins   Intractable nausea and vomiting -due to passing his kidney stone as above, also potentially complicated by his accelerated hypertension.  PRN antiemetics and IV fluids tonight  Chart review performed and case discussed with ED provider. Labs, imaging and/or ECG reviewed by provider and discussed with patient/family. Management plans discussed with the patient and/or family.  DVT PROPHYLAXIS: SubQ heparin  GI PROPHYLAXIS: None  ADMISSION STATUS: Observation  CODE STATUS: Full  TOTAL TIME TAKING CARE OF THIS PATIENT: 40 minutes.   Dystany Duffy FIELDING 02/21/2018, 9:42 PM  [facility]  Office  [phone]  CC: Primary care physician; Patient, No Pcp Per  Note:  This document was prepared using Dragon voice recognition software and may include unintentional dictation errors.

## 2018-02-21 NOTE — ED Triage Notes (Signed)
Pt c/o L sided abdominal pain that started Thursday night. Pt states multiple episodes of vomiting, 5-6 in the last 24 hrs. Pt states has been unable to keep fluids or food down since Thursday. Pt noted to be hypertensive in triage, states has not been able to keep his BP medications down. Pt reports on Thursday had several different types of alcohol and that is when the pain started.

## 2018-02-21 NOTE — Progress Notes (Signed)
Paged prime to clarify nitro gtt orders. See emar.

## 2018-02-22 ENCOUNTER — Encounter: Payer: Self-pay | Admitting: Internal Medicine

## 2018-02-22 DIAGNOSIS — N132 Hydronephrosis with renal and ureteral calculous obstruction: Secondary | ICD-10-CM

## 2018-02-22 DIAGNOSIS — R112 Nausea with vomiting, unspecified: Secondary | ICD-10-CM

## 2018-02-22 DIAGNOSIS — R1032 Left lower quadrant pain: Secondary | ICD-10-CM

## 2018-02-22 LAB — CBC
HCT: 34.9 % — ABNORMAL LOW (ref 40.0–52.0)
HEMOGLOBIN: 11.7 g/dL — AB (ref 13.0–18.0)
MCH: 27.9 pg (ref 26.0–34.0)
MCHC: 33.5 g/dL (ref 32.0–36.0)
MCV: 83.4 fL (ref 80.0–100.0)
PLATELETS: 318 10*3/uL (ref 150–440)
RBC: 4.19 MIL/uL — AB (ref 4.40–5.90)
RDW: 15.3 % — ABNORMAL HIGH (ref 11.5–14.5)
WBC: 12.6 10*3/uL — AB (ref 3.8–10.6)

## 2018-02-22 LAB — BASIC METABOLIC PANEL
ANION GAP: 7 (ref 5–15)
BUN: 25 mg/dL — ABNORMAL HIGH (ref 6–20)
CALCIUM: 8.4 mg/dL — AB (ref 8.9–10.3)
CO2: 24 mmol/L (ref 22–32)
CREATININE: 1.96 mg/dL — AB (ref 0.61–1.24)
Chloride: 105 mmol/L (ref 101–111)
GFR, EST AFRICAN AMERICAN: 50 mL/min — AB (ref >60)
GFR, EST NON AFRICAN AMERICAN: 43 mL/min — AB (ref >60)
Glucose, Bld: 97 mg/dL (ref 65–99)
Potassium: 3.7 mmol/L (ref 3.5–5.1)
SODIUM: 136 mmol/L (ref 135–145)

## 2018-02-22 LAB — URINE DRUG SCREEN, QUALITATIVE (ARMC ONLY)
AMPHETAMINES, UR SCREEN: NOT DETECTED
Barbiturates, Ur Screen: NOT DETECTED
Benzodiazepine, Ur Scrn: NOT DETECTED
COCAINE METABOLITE, UR ~~LOC~~: NOT DETECTED
Cannabinoid 50 Ng, Ur ~~LOC~~: POSITIVE — AB
MDMA (ECSTASY) UR SCREEN: NOT DETECTED
METHADONE SCREEN, URINE: NOT DETECTED
OPIATE, UR SCREEN: NOT DETECTED
PHENCYCLIDINE (PCP) UR S: NOT DETECTED
Tricyclic, Ur Screen: NOT DETECTED

## 2018-02-22 MED ORDER — LABETALOL HCL 100 MG PO TABS
100.0000 mg | ORAL_TABLET | Freq: Two times a day (BID) | ORAL | Status: DC
  Administered 2018-02-22 – 2018-02-23 (×3): 100 mg via ORAL
  Filled 2018-02-22 (×4): qty 1

## 2018-02-22 MED ORDER — OXYCODONE-ACETAMINOPHEN 5-325 MG PO TABS
1.0000 | ORAL_TABLET | ORAL | Status: DC | PRN
  Administered 2018-02-22: 1 via ORAL
  Filled 2018-02-22: qty 1

## 2018-02-22 MED ORDER — POLYETHYLENE GLYCOL 3350 17 G PO PACK
17.0000 g | PACK | Freq: Two times a day (BID) | ORAL | Status: AC
  Administered 2018-02-22 (×2): 17 g via ORAL
  Filled 2018-02-22 (×2): qty 1

## 2018-02-22 MED ORDER — MORPHINE SULFATE (PF) 2 MG/ML IV SOLN: 2.0000 mg | Freq: Four times a day (QID) | INTRAVENOUS | Status: DC | PRN

## 2018-02-22 MED ORDER — AMLODIPINE BESYLATE 5 MG PO TABS
5.0000 mg | ORAL_TABLET | Freq: Every day | ORAL | Status: DC
Start: 2018-02-22 — End: 2018-02-22
  Administered 2018-02-22: 5 mg via ORAL
  Filled 2018-02-22: qty 1

## 2018-02-22 MED ORDER — AMLODIPINE BESYLATE 10 MG PO TABS
10.0000 mg | ORAL_TABLET | Freq: Every day | ORAL | Status: DC
Start: 2018-02-23 — End: 2018-02-23
  Administered 2018-02-23: 10 mg via ORAL
  Filled 2018-02-22: qty 1

## 2018-02-22 MED ORDER — HYDROCHLOROTHIAZIDE 50 MG PO TABS
50.0000 mg | ORAL_TABLET | Freq: Every day | ORAL | Status: DC
  Administered 2018-02-22 – 2018-02-23 (×2): 50 mg via ORAL
  Filled 2018-02-22: qty 1
  Filled 2018-02-22 (×2): qty 2
  Filled 2018-02-22: qty 1

## 2018-02-22 MED ORDER — LABETALOL HCL 5 MG/ML IV SOLN
20.0000 mg | INTRAVENOUS | Status: DC | PRN
  Administered 2018-02-23: 20 mg via INTRAVENOUS
  Filled 2018-02-22: qty 4

## 2018-02-22 MED ORDER — SODIUM CHLORIDE 0.9 % IV SOLN
INTRAVENOUS | Status: AC
  Administered 2018-02-22: 10:00:00 via INTRAVENOUS

## 2018-02-22 NOTE — Progress Notes (Signed)
[facility] - [place] [facility]   PATIENT NAME: Jeremy Hudson    MR#:  [id number]  DATE OF BIRTH:  05-22-1981  SUBJECTIVE:  CHIEF COMPLAINT:   Chief Complaint  Patient presents with  . Abdominal Pain   Continues to have abdominal pain, nausea.  REVIEW OF SYSTEMS:    Review of Systems  Constitutional: Positive for malaise/fatigue. Negative for chills and fever.  HENT: Negative for sore throat.   Eyes: Negative for blurred vision, double vision and pain.  Respiratory: Negative for cough, hemoptysis, shortness of breath and wheezing.   Cardiovascular: Negative for chest pain, palpitations, orthopnea and leg swelling.  Gastrointestinal: Positive for abdominal pain, constipation and nausea. Negative for diarrhea, heartburn and vomiting.  Genitourinary: Negative for dysuria and hematuria.  Musculoskeletal: Negative for back pain and joint pain.  Skin: Negative for rash.  Neurological: Negative for sensory change, speech change, focal weakness and headaches.  Endo/Heme/Allergies: Does not bruise/bleed easily.  Psychiatric/Behavioral: Negative for depression. The patient is not nervous/anxious.     DRUG ALLERGIES:  No Known Allergies  VITALS:  Blood pressure (!) 177/91, pulse 67, temperature 98.3 F (36.8 C), temperature source Oral, resp. rate 18, height 5\' 5"  (1.651 m), weight 94.9 kg (209 lb 3.2 oz), SpO2 98 %.  PHYSICAL EXAMINATION:   Physical Exam  GENERAL:  37 y.o.-year-old patient lying in the bed with no acute distress.  EYES: Pupils equal, round, reactive to light and accommodation. No scleral icterus. Extraocular muscles intact.  HEENT: Head atraumatic, normocephalic. Oropharynx and nasopharynx clear.  NECK:  Supple, no jugular venous distention. No thyroid enlargement, no tenderness.  LUNGS: Normal breath sounds bilaterally, no wheezing, rales, rhonchi. No use of accessory muscles of respiration.  CARDIOVASCULAR: S1, S2 normal. No murmurs, rubs,  or gallops.  ABDOMEN: Soft, lower abdominal tenderness, nondistended. Bowel sounds present. No organomegaly or mass.  EXTREMITIES: No cyanosis, clubbing or edema b/l.    NEUROLOGIC: Cranial nerves II through XII are intact. No focal Motor or sensory deficits b/l.   PSYCHIATRIC: The patient is alert and oriented x 3.  SKIN: No obvious rash, lesion, or ulcer.   LABORATORY PANEL:   CBC Recent Labs  Lab 02/22/18 0456  WBC 12.6*  HGB 11.7*  HCT 34.9*  PLT 318   ------------------------------------------------------------------------------------------------------------------ Chemistries  Recent Labs  Lab 02/21/18 1812 02/22/18 0456  NA 136 136  K 3.7 3.7  CL 101 105  CO2 25 24  GLUCOSE 120* 97  BUN 28* 25*  CREATININE 2.31* 1.96*  CALCIUM 9.3 8.4*  AST 27  --   ALT 46  --   ALKPHOS 110  --   BILITOT 1.1  --    ------------------------------------------------------------------------------------------------------------------  Cardiac Enzymes Recent Labs  Lab 02/21/18 1812  TROPONINI <0.03   ------------------------------------------------------------------------------------------------------------------  RADIOLOGY:  Ct Angio Chest/abd/pel For Dissection W And/or Wo Contrast  Result Date: 02/21/2018 CLINICAL DATA:  Left-sided abdominal pain for several days, recent episodes of vomiting with recent alcohol intake EXAM: CT ANGIOGRAPHY CHEST, ABDOMEN AND PELVIS TECHNIQUE: Multidetector CT imaging through the chest, abdomen and pelvis was performed using the standard protocol during bolus administration of intravenous contrast. Multiplanar reconstructed images and MIPs were obtained and reviewed to evaluate the vascular anatomy. CONTRAST:  60mL OMNIPAQUE IOHEXOL 350 MG/ML SOLN COMPARISON:  None. FINDINGS: CTA CHEST FINDINGS Cardiovascular: Thoracic aorta demonstrates a bovine branching pattern. No aneurysmal dilatation or dissection is seen. The heart is not significantly  enlarged. No coronary calcifications are seen. No pericardial effusion is  noted. The pulmonary artery is poorly evaluated although no large central embolus is seen. Mediastinum/Nodes: Thoracic inlet is within normal limits. The esophagus is well visualized and within normal limits. No hilar or mediastinal adenopathy is noted. Lungs/Pleura: Lungs are well aerated bilaterally without focal infiltrate or sizable effusion. No parenchymal nodules are seen. Musculoskeletal: No acute bony abnormality is noted. Review of the MIP images confirms the above findings. CTA ABDOMEN AND PELVIS FINDINGS VASCULAR Aorta: Normal caliber aorta without aneurysm, dissection, vasculitis or significant stenosis. Celiac: No significant atherosclerotic changes are noted. Some downward depression likely related to median arcuate ligament compression is seen. SMA: Patent without evidence of aneurysm, dissection, vasculitis or significant stenosis. Renals: Single renal artery is noted on the right. Dual renal arteries are noted on the left. No focal stenosis is noted. IMA: Patent without evidence of aneurysm, dissection, vasculitis or significant stenosis. Iliacs: Patent without evidence of aneurysm, dissection, vasculitis or significant stenosis. Veins: No venous abnormality is noted. Review of the MIP images confirms the above findings. NON-VASCULAR Hepatobiliary: Fatty infiltration of the liver is noted. The gallbladder is well distended. Pancreas: Unremarkable. No pancreatic ductal dilatation or surrounding inflammatory changes. Spleen: Normal in size without focal abnormality. Adrenals/Urinary Tract: The adrenal glands are within normal limits bilaterally. The right kidney is unremarkable. The left kidney demonstrates decreased enhancement consistent with underlying obstructive change. There is mild hydronephrosis identified secondary to a 5 mm stone lodged at the left UVJ. The bladder is well distended. No obstructive changes on the right  are seen. Stomach/Bowel: Stomach is within normal limits. Appendix appears normal. No evidence of bowel wall thickening, distention, or inflammatory changes. Lymphatic: No significant lymphadenopathy is identified. Reproductive: Prostate is unremarkable. Other: No abdominal wall hernia or abnormality. No abdominopelvic ascites. Musculoskeletal: No acute or significant osseous findings. Review of the MIP images confirms the above findings. IMPRESSION: 5 mm left UVJ stone with mild hydronephrosis and decreased enhancement of the left kidney secondary to the obstructive change. Mild vascular variance as described above. Fatty liver. No other focal abnormality is noted. Electronically Signed   By: Alcide Clever M.D.   On: 02/21/2018 20:42     ASSESSMENT AND PLAN:   *Accelerated hypertension We will start patient on amlodipine and hydrochlorothiazide.  Add labetalol.  Stop the nitro drip.  Use IV as needed medications.  Would target lowering blood pressure slowly.  Patient will need ongoing secondary hypertension workup as outpatient.  *Left UVJ 5 mm stone with mild hydronephrosis and acute kidney injury Will request urology consultation for further input.  Hopefully patient will pass the stone on his own.  Advised to strain urine.  *AKI over CKD stage III secondary to UVJ stone and also accelerated hypertension Improving.  Continue IV fluids.  Monitor input and output.  Repeat labs in the morning.  *DVT prophylaxis with heparin  All the records are reviewed and case discussed with Care Management/Social Worker Management plans discussed with the patient, family and they are in agreement.  CODE STATUS: Full code  DVT Prophylaxis: SCDs  TOTAL TIME TAKING CARE OF THIS PATIENT: 35 minutes.   POSSIBLE D/C IN 1-2 DAYS, DEPENDING ON CLINICAL CONDITION.  Orie Fisherman M.D on 02/22/2018 at 10:52 AM  Between 7am to 6pm - Pager - [phone]  After 6pm go to [email or web address] - password [email or web address]  [facility]  [facility] [place] [facility]  Office  [phone]  CC: Primary care physician; Patient, No Pcp Per  Note: This dictation was prepared with Dragon dictation along with  smaller phrase technology. Any transcriptional errors that result from this process are unintentional.

## 2018-02-22 NOTE — Consult Note (Signed)
Urology Consult  Consulting MD: Milagros Loll  MD  CC: Kidney stone  HPI: This is a 37year old male without prior urologic history who was admitted to the hospital yesterday with left flank pain, nausea and vomiting.  The patient attributed his symptoms to alcohol intake several days before.  He is also hypertensive.  Evaluation involved CT angios of chest/abdomen/pelvis.  This revealed a 4 mm left UVJ stone with left hydronephrosis.  The patient was admitted for management of his pain as well as significant hypertension.  Currently,  his pain is fairly well controlled.  Baseline creatinine in November, 2018 was 1.4.  It was approximately 2.3 in the evening on 4/21, with hydration is down to 1.96 this morning.  The patient does not have gross hematuria, dysuria, but does have urinary frequency.  PMH: Past Medical History:  Diagnosis Date  . Hypertension     PSH: Past Surgical History:  Procedure Laterality Date  . NO PAST SURGERIES      Allergies: No Known Allergies  Medications: Medications Prior to Admission  Medication Sig Dispense Refill Last Dose  . hydrochlorothiazide (HYDRODIURIL) 50 MG tablet Take 1 tablet (50 mg total) by mouth daily. 30 tablet 0 Past Week at Unknown time  . oxyCODONE-acetaminophen (ROXICET) 5-325 MG tablet Take 1 tablet by mouth every 4 (four) hours as needed for severe pain. (Patient not taking: Reported on 02/21/2018) 15 tablet 0 Not Taking at Unknown time     Social History: Social History   Socioeconomic History  . Marital status: Single    Spouse name: Not on file  . Number of children: Not on file  . Years of education: Not on file  . Highest education level: Not on file  Occupational History  . Not on file  Social Needs  . Financial resource strain: Not on file  . Food insecurity:    Worry: Not on file    Inability: Not on file  . Transportation needs:    Medical: Not on file    Non-medical: Not on file  Tobacco Use  . Smoking  status: Former Games developer  . Smokeless tobacco: Never Used  Substance and Sexual Activity  . Alcohol use: Yes  . Drug use: Yes    Types: Marijuana    Comment: Last use today  . Sexual activity: Not on file  Lifestyle  . Physical activity:    Days per week: Not on file    Minutes per session: Not on file  . Stress: Not on file  Relationships  . Social connections:    Talks on phone: Not on file    Gets together: Not on file    Attends religious service: Not on file    Active member of club or organization: Not on file    Attends meetings of clubs or organizations: Not on file    Relationship status: Not on file  . Intimate partner violence:    Fear of current or ex partner: Not on file    Emotionally abused: Not on file    Physically abused: Not on file    Forced sexual activity: Not on file  Other Topics Concern  . Not on file  Social History Narrative  . Not on file    Family History: History reviewed. No pertinent family history.  Review of Systems: Positive: Left flank pain, nausea, vomiting Negative: . A further 10 point review of systems was negative except what is listed in the HPI.  Physical Exam: @VITALS2 @ General:  No acute distress.  Awake. Head:  Normocephalic.  Atraumatic. ENT:  EOMI.  Mucous membranes moist Neck:  Supple.  No lymphadenopathy. CV:  Regular rate. Pulmonary: Equal effort bilaterally.  Clear to auscultation bilaterally. Abdomen: Soft.  Left lower quadrant mildly tender to palpation. Skin:  Normal turgor.  No visible rash. Extremity: No gross deformity of bilateral upper extremities.  No gross deformity of bilateral lower extremities. Neurologic: Alert. Appropriate mood.    Studies:  Recent Labs    02/21/18 1812 02/22/18 0456  HGB 13.7 11.7*  WBC 17.4* 12.6*  PLT 354 318    Recent Labs    02/21/18 1812 02/22/18 0456  NA 136 136  K 3.7 3.7  CL 101 105  CO2 25 24  BUN 28* 25*  CREATININE 2.31* 1.96*  CALCIUM 9.3 8.4*   GFRNONAA 35* 43*  GFRAA 41* 50*     No results for input(s): INR, APTT in the last 72 hours.  Invalid input(s): PT   Invalid input(s): ABG  I independently reviewed the patient's CT images.   Assessment: 5 mm left UVJ stone-- the patient currently comfortable.  He did have a bump in his creatinine which may well be secondary to his significant hypertension.  Plan: 1.  I think it is fine to keep the patient comfortable, put him on medical expulsive therapy (he is already on tamsulosin) and allow him to go home once blood pressure adequately controlled  2.  Follow-up in our [place] office within the next week or 2  3.  Strain urine, save stone  4.  If further issues regarding his stone during this hospitalization, please call us.  Otherwise, we will see on an as-needed basis here in the hospital    Pager:[phone]

## 2018-02-23 LAB — BASIC METABOLIC PANEL
Anion gap: 7 (ref 5–15)
BUN: 18 mg/dL (ref 6–20)
CHLORIDE: 101 mmol/L (ref 101–111)
CO2: 26 mmol/L (ref 22–32)
CREATININE: 1.94 mg/dL — AB (ref 0.61–1.24)
Calcium: 8.9 mg/dL (ref 8.9–10.3)
GFR calc Af Amer: 50 mL/min — ABNORMAL LOW (ref >60)
GFR calc non Af Amer: 43 mL/min — ABNORMAL LOW (ref >60)
Glucose, Bld: 99 mg/dL (ref 65–99)
POTASSIUM: 3.3 mmol/L — AB (ref 3.5–5.1)
Sodium: 134 mmol/L — ABNORMAL LOW (ref 135–145)

## 2018-02-23 LAB — HIV ANTIBODY (ROUTINE TESTING W REFLEX): HIV Screen 4th Generation wRfx: NONREACTIVE

## 2018-02-23 MED ORDER — LABETALOL HCL 100 MG PO TABS: 100.0000 mg | ORAL_TABLET | Freq: Two times a day (BID) | ORAL | 0 refills | Status: AC

## 2018-02-23 MED ORDER — HYDRALAZINE HCL 100 MG PO TABS: 100.0000 mg | ORAL_TABLET | Freq: Three times a day (TID) | ORAL | 0 refills | Status: AC

## 2018-02-23 MED ORDER — POTASSIUM CHLORIDE CRYS ER 20 MEQ PO TBCR
40.0000 meq | EXTENDED_RELEASE_TABLET | Freq: Once | ORAL | Status: AC
  Administered 2018-02-23: 40 meq via ORAL
  Filled 2018-02-23: qty 2

## 2018-02-23 MED ORDER — HYDRALAZINE HCL 25 MG PO TABS
25.0000 mg | ORAL_TABLET | Freq: Three times a day (TID) | ORAL | Status: DC
  Administered 2018-02-23: 25 mg via ORAL
  Filled 2018-02-23: qty 1

## 2018-02-23 MED ORDER — HYDRALAZINE HCL 50 MG PO TABS
100.0000 mg | ORAL_TABLET | Freq: Three times a day (TID) | ORAL | Status: DC
  Administered 2018-02-23: 100 mg via ORAL
  Filled 2018-02-23: qty 2

## 2018-02-23 MED ORDER — HYDROCHLOROTHIAZIDE 50 MG PO TABS: 50.0000 mg | ORAL_TABLET | Freq: Every day | ORAL | 0 refills | Status: AC

## 2018-02-23 MED ORDER — BLOOD PRESSURE KIT: 1.0000 [IU] | PACK | Freq: Every day | 0 refills | Status: AC

## 2018-02-23 MED ORDER — AMLODIPINE BESYLATE 10 MG PO TABS: 10.0000 mg | ORAL_TABLET | Freq: Every day | ORAL | 0 refills | Status: AC

## 2018-02-23 NOTE — Care Management (Signed)
Patient lives with his father and helps take care of him and does receive some financial suuport from his dad and mother.  He was recently laid off from work.  Was seen at [facility] but patient says he wants to switch clinics. An appointment has been made for patient at [facility].  Provided patient with applications for [facility]. Discussed that he could not use both pharmacies [facility] and [facility]. Has to be either or.  Patient's  discharge scripts  have been faxed to [facility].  Patient verbalizes understanding that will have to be picked up no later than 4:30p.  His mother is en route to transport.

## 2018-02-23 NOTE — Plan of Care (Signed)
  Problem: Elimination: Goal: Will not experience complications related to urinary retention Outcome: Progressing   Problem: Pain Managment: Goal: General experience of comfort will improve Outcome: Progressing   

## 2018-02-23 NOTE — Progress Notes (Signed)
IV and tele removed from patient. Discharge instructions given to patient along with hard copy prescriptions. No distress at this time. Mother will be transporting patient home.

## 2018-03-04 NOTE — Discharge Summary (Signed)
[place] [facility] [place] NAME: Jeremy Hudson    MR#:  [id number]  DATE OF BIRTH:  08/25/1981  DATE OF ADMISSION:  02/21/2018 ADMITTING PHYSICIAN: Lance Coon, MD  DATE OF DISCHARGE: 02/23/2018  4:08 PM  PRIMARY CARE PHYSICIAN: Patient, No Pcp Per   ADMISSION DIAGNOSIS:  Ureterolithiasis [N20.1] AKI (acute kidney injury) ([place]) [N17.9] Hypertensive emergency [I16.1]  DISCHARGE DIAGNOSIS:  Principal Problem:   Accelerated hypertension Active Problems:   Kidney stone   AKI (acute kidney injury) ([place])   Intractable nausea and vomiting   SECONDARY DIAGNOSIS:   Past Medical History:  Diagnosis Date  . Hypertension      ADMITTING HISTORY  HISTORY OF PRESENT ILLNESS:  Jeremy Hudson  is a 37 y.o. male who presents with abdominal/flank pain for the past 3 days.  Patient has had associated nausea and vomiting persistently.  Here in the ED tonight he was found to have kidney stone.  His blood pressure is also significantly elevated, and somewhat difficult to control.  Patient states he has not been able to keep down any of his antihypertensives at home due to his nausea and vomiting.  He also has some AKI and leukocytosis, but does not appear toxic at this time, and has no clearly identified infectious etiology.  Hospitalist called for admission  HOSPITAL COURSE:   *Accelerated hypertension Patient started on amlodipine and hydrochlorothiazide.  Blood pressure remain uncontrolled.  Later labetalol was added.  Blood pressure is much improved.  It still mildly elevated but would want to control blood pressure slowly as outpatient. Initially was on nitro drip which has been stopped. He will need secondary hypertension work-up as outpatient with primary care physician.  *Left UVJ 5 mm stone with mild hydronephrosis and acute kidney injury.  Patient does have baseline CKD 3 which has progressively worsened due to uncontrolled hypertension.  By the time  of discharge his stone has passed.  He has no further pain.  Creatinine is stable.  Good urine output. Patient was seen by urology.  Follow-up as outpatient if any further problems.  Patient discharged home in stable condition with prescriptions for new medications.  CONSULTS OBTAINED:  Treatment Team:  Franchot Gallo, MD  DRUG ALLERGIES:  No Known Allergies  DISCHARGE MEDICATIONS:   Allergies as of 02/23/2018   No Known Allergies     Medication List    TAKE these medications   amLODipine 10 MG tablet Commonly known as:  NORVASC Take 1 tablet (10 mg total) by mouth daily.   Blood Pressure Kit 1 Units by Does not apply route daily.   hydrALAZINE 100 MG tablet Commonly known as:  APRESOLINE Take 1 tablet (100 mg total) by mouth every 8 (eight) hours.   hydrochlorothiazide 50 MG tablet Commonly known as:  HYDRODIURIL Take 1 tablet (50 mg total) by mouth daily. What changed:  how much to take   labetalol 100 MG tablet Commonly known as:  NORMODYNE Take 1 tablet (100 mg total) by mouth 2 (two) times daily.   oxyCODONE-acetaminophen 5-325 MG tablet Commonly known as:  ROXICET Take 1 tablet by mouth every 4 (four) hours as needed for severe pain.       Today   VITAL SIGNS:  Blood pressure (!) 159/105, pulse 75, temperature 98.4 F (36.9 C), temperature source Oral, resp. rate 16, height 5' 5"  (1.651 m), weight 94.9 kg (209 lb 3.2 oz), SpO2 99 %.  I/O:  No intake or output data in  the 24 hours ending 03/04/18 1213  PHYSICAL EXAMINATION:  Physical Exam  GENERAL:  37 y.o.-year-old patient lying in the bed with no acute distress.  LUNGS: Normal breath sounds bilaterally, no wheezing, rales,rhonchi or crepitation. No use of accessory muscles of respiration.  CARDIOVASCULAR: S1, S2 normal. No murmurs, rubs, or gallops.  ABDOMEN: Soft, non-tender, non-distended. Bowel sounds present. No organomegaly or mass.  NEUROLOGIC: Moves all 4 extremities. PSYCHIATRIC: The  patient is alert and oriented x 3.  SKIN: No obvious rash, lesion, or ulcer.   DATA REVIEW:   CBC No results for input(s): WBC, HGB, HCT, PLT in the last 168 hours.  Chemistries  No results for input(s): NA, K, CL, CO2, GLUCOSE, BUN, CREATININE, CALCIUM, MG, AST, ALT, ALKPHOS, BILITOT in the last 168 hours.  Invalid input(s): GFRCGP  Cardiac Enzymes No results for input(s): TROPONINI in the last 168 hours.  Microbiology Results  No results found for this or any previous visit.  RADIOLOGY:  No results found.  Follow up with PCP in 1 week.  Management plans discussed with the patient, family and they are in agreement.  CODE STATUS:  Code Status History    Date Active Date Inactive Code Status Order ID Comments User Context   02/21/2018 2241 02/23/2018 1908 Full Code [id number]  Lance Coon, MD Inpatient      TOTAL TIME TAKING CARE OF THIS PATIENT ON DAY OF DISCHARGE: more than 30 minutes.   Leia Alf Tomasz Steeves M.D on 03/04/2018 at 12:13 PM  Between 7am to 6pm - Pager - [phone]  After 6pm go to [email or web address] - password [email or web address] [place] [facility]  Office  [phone]  CC: Primary care physician; Patient, No Pcp Per  Note: This dictation was prepared with Dragon dictation along with smaller phrase technology. Any transcriptional errors that result from this process are unintentional.

## 2021-03-29 ENCOUNTER — Inpatient Hospital Stay
Admission: EM | Admit: 2021-03-29 | Discharge: 2021-04-01 | DRG: 065 | Disposition: A | Discharge status: Home / Self Care | Payer: Self-pay | Attending: Family Medicine | Admitting: Family Medicine

## 2021-03-29 ENCOUNTER — Emergency Department: Payer: Self-pay

## 2021-03-29 ENCOUNTER — Other Ambulatory Visit: Payer: Self-pay

## 2021-03-29 ENCOUNTER — Encounter: Payer: Self-pay | Admitting: Emergency Medicine

## 2021-03-29 DIAGNOSIS — N179 Acute kidney failure, unspecified: Secondary | ICD-10-CM | POA: Diagnosis present

## 2021-03-29 DIAGNOSIS — R509 Fever, unspecified: Secondary | ICD-10-CM | POA: Diagnosis present

## 2021-03-29 DIAGNOSIS — N1832 Chronic kidney disease, stage 3b: Secondary | ICD-10-CM | POA: Diagnosis present

## 2021-03-29 DIAGNOSIS — G8311 Monoplegia of lower limb affecting right dominant side: Secondary | ICD-10-CM | POA: Diagnosis present

## 2021-03-29 DIAGNOSIS — Z20822 Contact with and (suspected) exposure to covid-19: Secondary | ICD-10-CM | POA: Diagnosis present

## 2021-03-29 DIAGNOSIS — N183 Chronic kidney disease, stage 3 unspecified: Secondary | ICD-10-CM | POA: Diagnosis present

## 2021-03-29 DIAGNOSIS — I129 Hypertensive chronic kidney disease with stage 1 through stage 4 chronic kidney disease, or unspecified chronic kidney disease: Secondary | ICD-10-CM | POA: Diagnosis present

## 2021-03-29 DIAGNOSIS — I6381 Other cerebral infarction due to occlusion or stenosis of small artery: Principal | ICD-10-CM | POA: Diagnosis present

## 2021-03-29 DIAGNOSIS — D72829 Elevated white blood cell count, unspecified: Secondary | ICD-10-CM | POA: Diagnosis present

## 2021-03-29 DIAGNOSIS — H538 Other visual disturbances: Secondary | ICD-10-CM | POA: Diagnosis present

## 2021-03-29 DIAGNOSIS — R29810 Facial weakness: Secondary | ICD-10-CM | POA: Diagnosis present

## 2021-03-29 DIAGNOSIS — R9431 Abnormal electrocardiogram [ECG] [EKG]: Secondary | ICD-10-CM | POA: Diagnosis present

## 2021-03-29 DIAGNOSIS — I639 Cerebral infarction, unspecified: Principal | ICD-10-CM | POA: Diagnosis present

## 2021-03-29 DIAGNOSIS — Z79899 Other long term (current) drug therapy: Secondary | ICD-10-CM

## 2021-03-29 DIAGNOSIS — Z823 Family history of stroke: Secondary | ICD-10-CM

## 2021-03-29 DIAGNOSIS — R29704 NIHSS score 4: Secondary | ICD-10-CM | POA: Diagnosis present

## 2021-03-29 DIAGNOSIS — I10 Essential (primary) hypertension: Secondary | ICD-10-CM | POA: Diagnosis present

## 2021-03-29 DIAGNOSIS — E876 Hypokalemia: Secondary | ICD-10-CM | POA: Diagnosis present

## 2021-03-29 DIAGNOSIS — G510 Bell's palsy: Secondary | ICD-10-CM

## 2021-03-29 DIAGNOSIS — Z87891 Personal history of nicotine dependence: Secondary | ICD-10-CM

## 2021-03-29 HISTORY — DX: Cerebral infarction, unspecified: I63.9

## 2021-03-29 HISTORY — DX: Chronic kidney disease, stage 3 unspecified: N18.30

## 2021-03-29 HISTORY — DX: Anxiety disorder, unspecified: F41.9

## 2021-03-29 HISTORY — DX: Cardiomegaly: I51.7

## 2021-03-29 HISTORY — DX: Cannabis abuse, uncomplicated: F12.10

## 2021-03-29 LAB — COMPREHENSIVE METABOLIC PANEL
ALT: 59 U/L — ABNORMAL HIGH (ref 0–44)
AST: 37 U/L (ref 15–41)
Albumin: 4.6 g/dL (ref 3.5–5.0)
Alkaline Phosphatase: 98 U/L (ref 38–126)
Anion gap: 12 (ref 5–15)
BUN: 25 mg/dL — ABNORMAL HIGH (ref 6–20)
CO2: 22 mmol/L (ref 22–32)
Calcium: 10.1 mg/dL (ref 8.9–10.3)
Chloride: 103 mmol/L (ref 98–111)
Creatinine, Ser: 2.3 mg/dL — ABNORMAL HIGH (ref 0.61–1.24)
GFR, Estimated: 37 mL/min — ABNORMAL LOW (ref >60)
Glucose, Bld: 95 mg/dL (ref 70–99)
Potassium: 3.2 mmol/L — ABNORMAL LOW (ref 3.5–5.1)
Sodium: 137 mmol/L (ref 135–145)
Total Bilirubin: 1.1 mg/dL (ref 0.3–1.2)
Total Protein: 8.5 g/dL — ABNORMAL HIGH (ref 6.5–8.1)

## 2021-03-29 LAB — CBC
HCT: 43.9 % (ref 39.0–52.0)
Hemoglobin: 14.6 g/dL (ref 13.0–17.0)
MCH: 27 pg (ref 26.0–34.0)
MCHC: 33.3 g/dL (ref 30.0–36.0)
MCV: 81.3 fL (ref 80.0–100.0)
Platelets: 386 10*3/uL (ref 150–400)
RBC: 5.4 MIL/uL (ref 4.22–5.81)
RDW: 14.7 % (ref 11.5–15.5)
WBC: 11.1 10*3/uL — ABNORMAL HIGH (ref 4.0–10.5)
nRBC: 0 % (ref 0.0–0.2)

## 2021-03-29 LAB — DIFFERENTIAL
Abs Immature Granulocytes: 0.05 10*3/uL (ref 0.00–0.07)
Basophils Absolute: 0.1 10*3/uL (ref 0.0–0.1)
Basophils Relative: 1 %
Eosinophils Absolute: 0.2 10*3/uL (ref 0.0–0.5)
Eosinophils Relative: 2 %
Immature Granulocytes: 1 %
Lymphocytes Relative: 26 %
Lymphs Abs: 2.9 10*3/uL (ref 0.7–4.0)
Monocytes Absolute: 1.1 10*3/uL — ABNORMAL HIGH (ref 0.1–1.0)
Monocytes Relative: 10 %
Neutro Abs: 6.8 10*3/uL (ref 1.7–7.7)
Neutrophils Relative %: 60 %

## 2021-03-29 LAB — APTT: aPTT: 27 seconds (ref 24–36)

## 2021-03-29 LAB — CBG MONITORING, ED
Glucose-Capillary: 111 mg/dL — ABNORMAL HIGH (ref 70–99)
Glucose-Capillary: 143 mg/dL — ABNORMAL HIGH (ref 70–99)

## 2021-03-29 LAB — PROTIME-INR
INR: 1 (ref 0.8–1.2)
Prothrombin Time: 12.9 seconds (ref 11.4–15.2)

## 2021-03-29 IMAGING — MR MR HEAD W/O CM
10 of 11 series · 36 of 48 positions shown · non-contrast
Comparison: Prior head CT from earlier the same day.

CLINICAL DATA: Initial evaluation for acute cranial 7 neuropathy.
Left-sided facial droop with right sided blurry vision.

EXAM:
MRI HEAD WITHOUT CONTRAST
TECHNIQUE: Multiplanar, multiecho pulse sequences of the brain and surrounding
structures were obtained without intravenous contrast.

[Series 5: ax dwi_tracew · axial · 3.0mm · 0.65mm/px · z∈[-80,+74]mm · 4 of 48 slices shown]
[im 1/48]
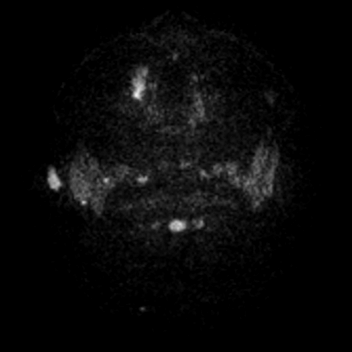
[im 16/48]
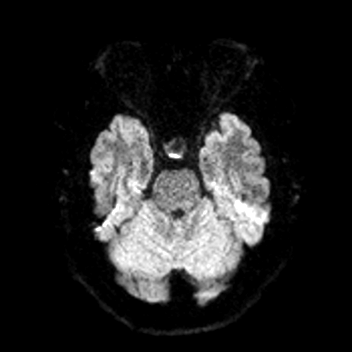
[im 32/48]
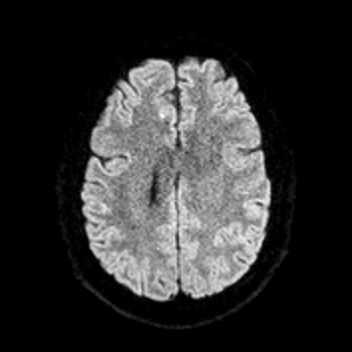
[im 48/48]
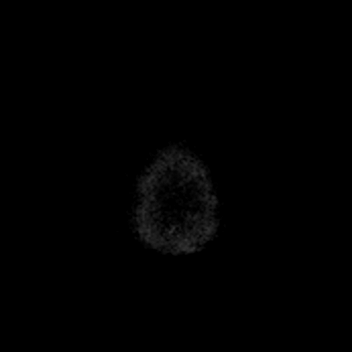

[Series 6: ax dwi_adc · axial · 3.0mm · 0.65mm/px · z∈[-80,+71]mm · 4 of 47 slices shown]
[im 1/47]
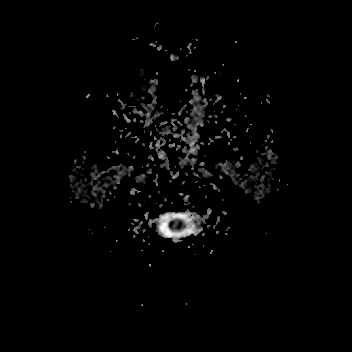
[im 16/47]
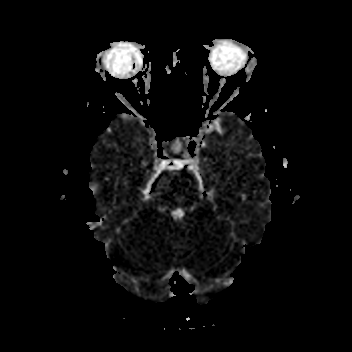
[im 31/47]
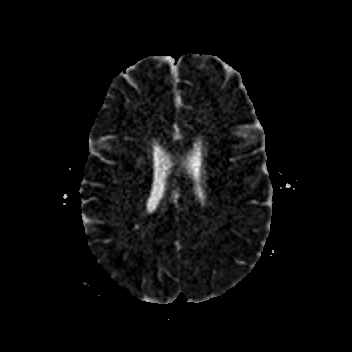
[im 47/47]
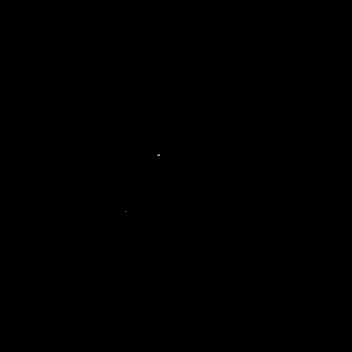

[Series 7: cor dwi_tracew · coronal · 5.0mm · 0.60mm/px · 3 of 38 slices shown]
[im 1/38]
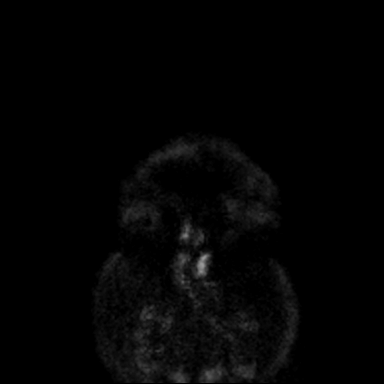
[im 19/38]
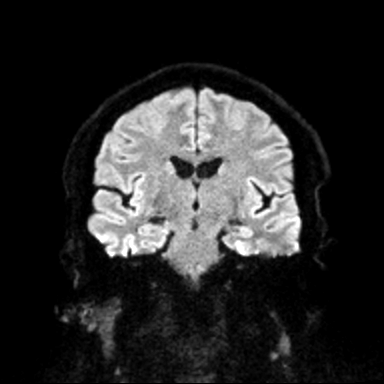
[im 38/38]
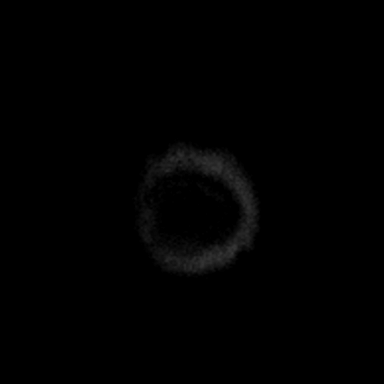

[Series 8: cor dwi_adc · coronal · 5.0mm · 0.60mm/px · 3 of 38 slices shown]
[im 1/38]
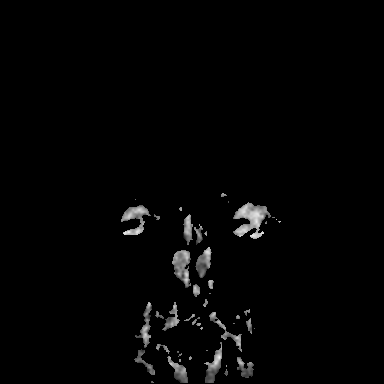
[im 19/38]
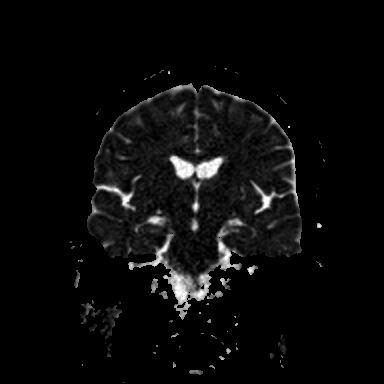
[im 38/38]
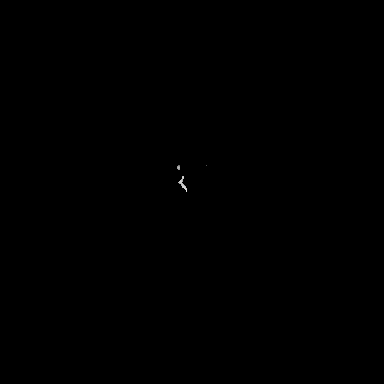

[Series 9: T1 · sagittal · 5.0mm · 0.62mm/px · 2 of 21 slices shown (1 of 2)]
[im 1/21]
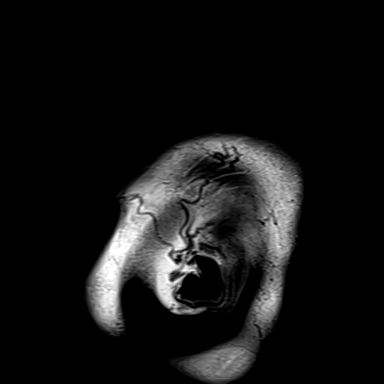
[im 21/21]
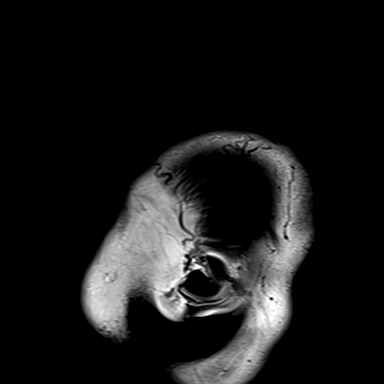

[Series 10: T2 · axial · 5.0mm · 0.45mm/px · z∈[-75,+68]mm · 2 of 25 slices shown (1 of 2)]
[im 1/25]
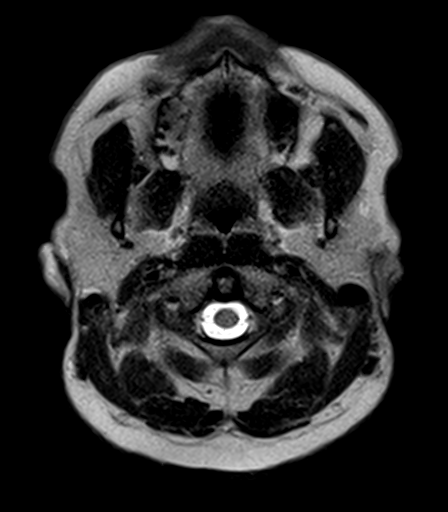
[im 25/25]
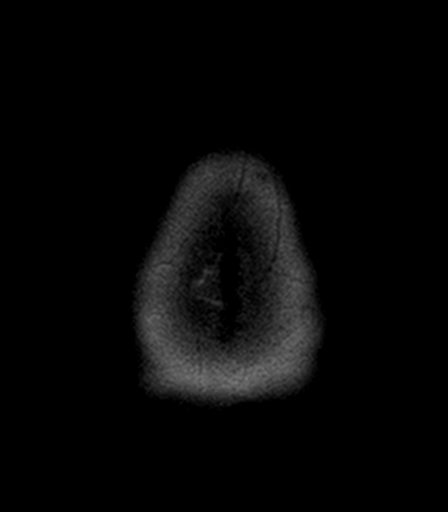

[Series 12: pha_images · axial · 3.0mm · 0.90mm/px · z∈[-80,-5]mm · 3 of 52 slices shown]
[im 1/52]
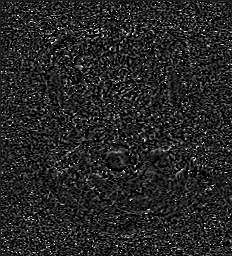
[im 13/52]
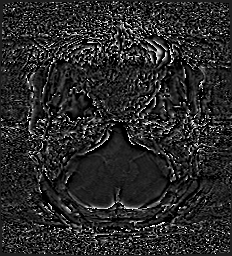
[im 26/52]
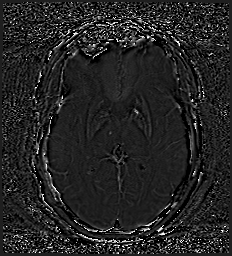

[Series 15: FLAIR · axial · 3.0mm · 0.53mm/px · z∈[-77,+69]mm · 4 of 50 slices shown]
[im 1/50]
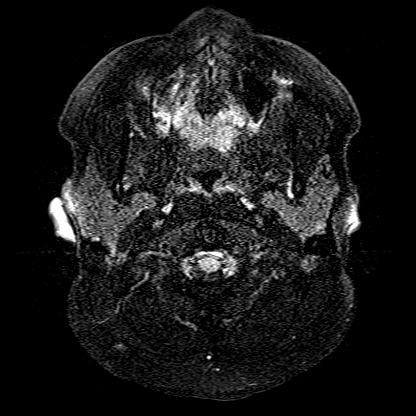
[im 17/50]
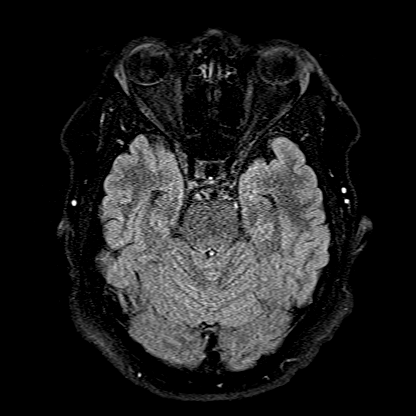
[im 33/50]
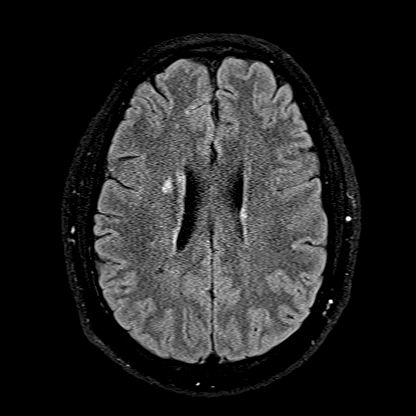
[im 50/50]
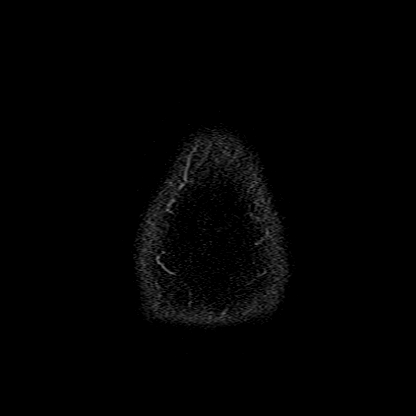

[Series 16: T1 · axial · 1.0mm · 0.98mm/px · z∈[-74,+69]mm · 8 of 144 slices shown (2 of 2)]
[im 1/144]
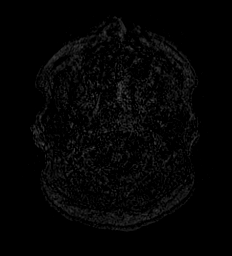
[im 24/144]
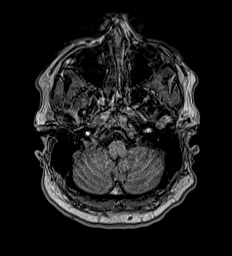
[im 48/144]
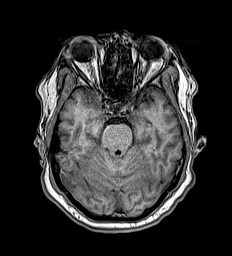
[im 60/144]
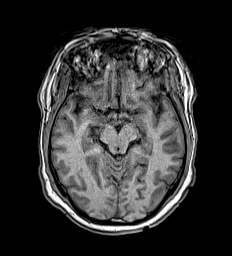
[im 84/144]
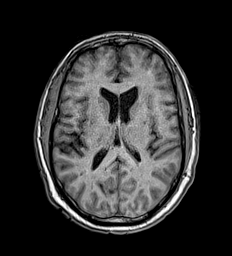
[im 96/144]
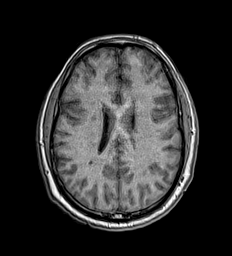
[im 120/144]
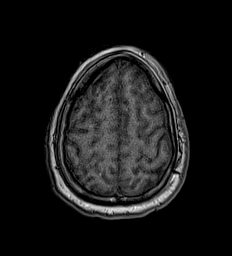
[im 144/144]
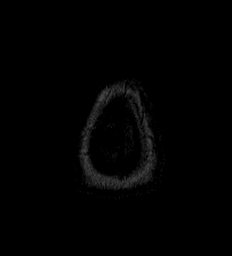

[Series 17: T2 · coronal · 5.0mm · 0.45mm/px · 3 of 31 slices shown (2 of 2)]
[im 1/31]
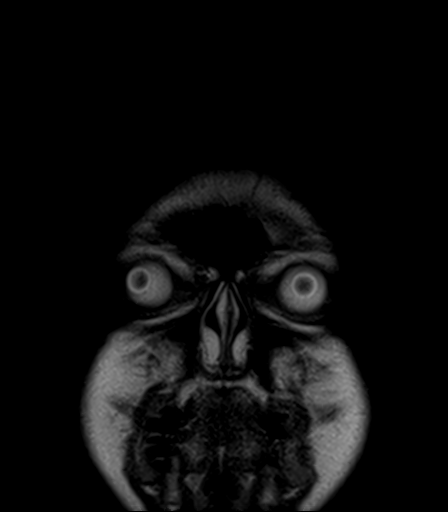
[im 16/31]
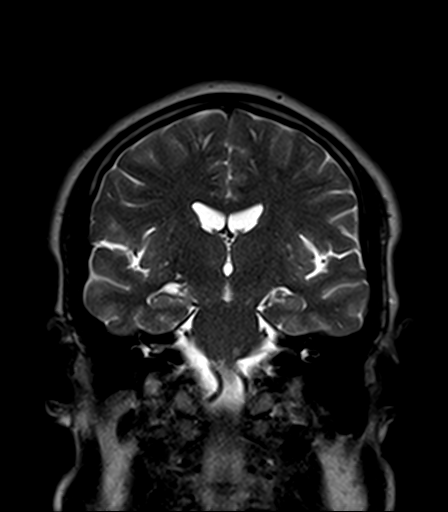
[im 31/31]
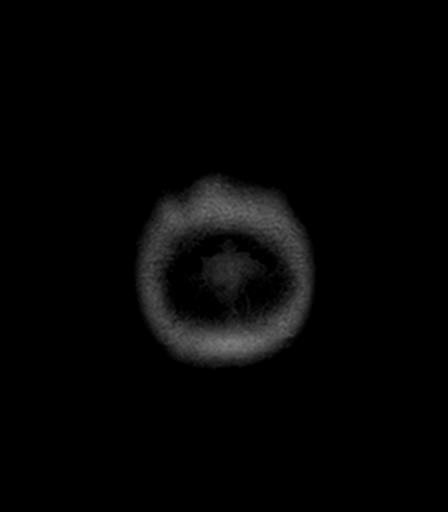

[36 of 48 positions shown; findings below may reference images not displayed]

FINDINGS: Brain: Mildly advanced cerebral atrophy for age with scattered
prominence of the perivascular spaces. Patchy T2/FLAIR
hyperintensity involving the periventricular white matter, most
consistent with chronic small vessel ischemic disease, also advanced
for age. Few scattered remote lacunar infarcts present about the
bilateral basal ganglia and right thalamus. Small chronic
microhemorrhage at the right thalamus likely related to small vessel
disease.

1.2 cm linear focus of restricted diffusion involving the left
paramedian pons, consistent with an acute small vessel type infarct
(series 5, image 13). This likely accounts for the patient's
symptoms given location. No associated hemorrhage or mass effect.

There is an additional 6 mm acute ischemic nonhemorrhagic infarct at
the posterior left lentiform nucleus (series 5, image 24). No
associated mass effect.

No other evidence for acute or subacute ischemia. Gray-white matter
differentiation otherwise maintained. No other areas of remote
cortical infarction. No acute intracranial hemorrhage.

No mass lesion, midline shift or mass effect. No hydrocephalus or
extra-axial fluid collection. Pituitary gland suprasellar region
normal. Midline structures intact.

Vascular: Major intracranial vascular flow voids are maintained.

Skull and upper cervical spine: Craniocervical junction within
normal limits. Bone marrow signal intensity normal. No scalp soft
tissue abnormality.

Sinuses/Orbits: Globes and orbital soft tissues within normal
limits. Paranasal sinuses are clear. No mastoid effusion. Inner ear
structures within normal limits.

Other: None.
IMPRESSION: 1. 1.2 cm acute ischemic nonhemorrhagic infarct involving the left
paramedian pons. This likely accounts for the patient's symptoms
given location.
2. Additional 6 mm acute ischemic nonhemorrhagic infarct at the
posterior left lentiform nucleus.
3. Underlying age-related cerebral atrophy with chronic
microvascular ischemic disease, with a few additional remote lacunar
infarcts about the deep gray nuclei. Overall, appearance is advanced
for age.

## 2021-03-29 IMAGING — CT CT HEAD CODE STROKE
4 series · 15 of 47 positions shown, 17 images · non-contrast
Comparison: None.

CLINICAL DATA: Code stroke. Initial evaluation for acute blurry
vision.

EXAM:
CT HEAD WITHOUT CONTRAST
TECHNIQUE: Contiguous axial images were obtained from the base of the skull
through the vertex without intravenous contrast.

[Series 3: head wo · axial · 0.44mm/px · z∈[-132,-22]mm · 6 of 32 slices shown, 8 images]
[im 5/32  brain]
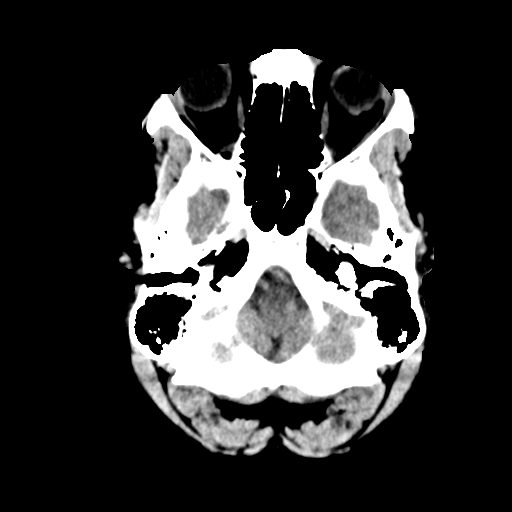
[im 5/32  bone]
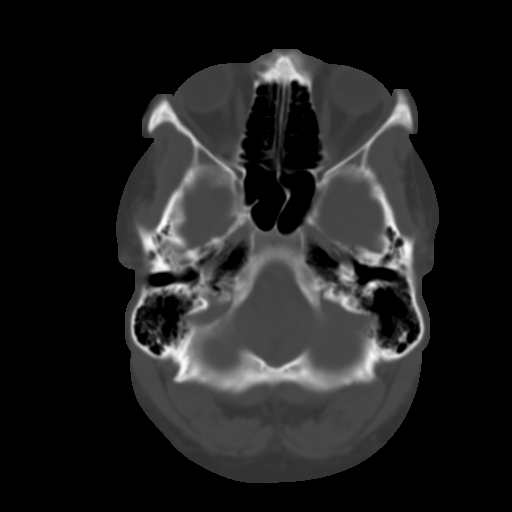
[im 9/32  brain]
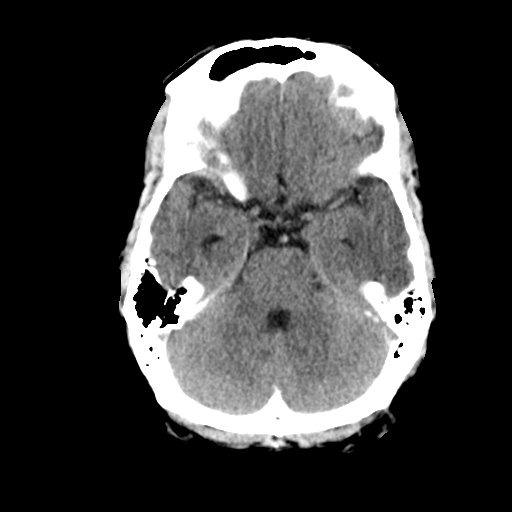
[im 14/32  brain]
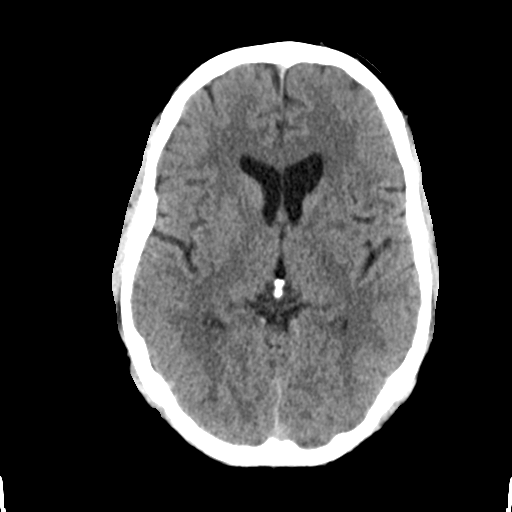
[im 18/32  brain]
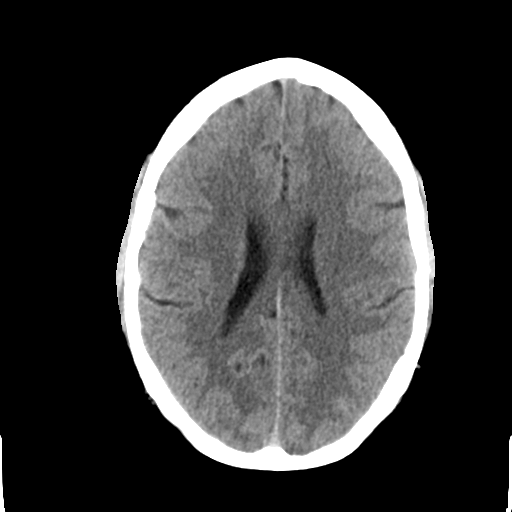
[im 23/32  brain]
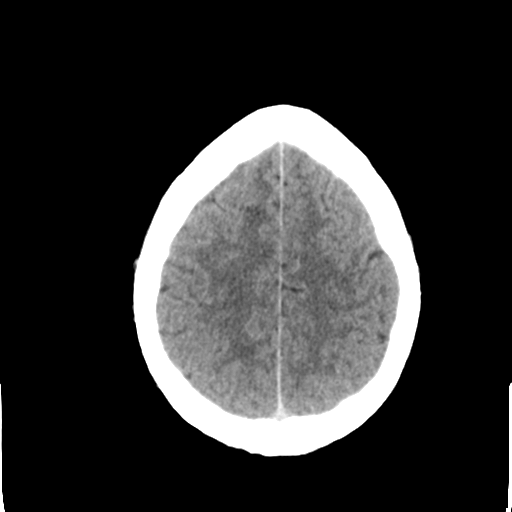
[im 23/32  bone]
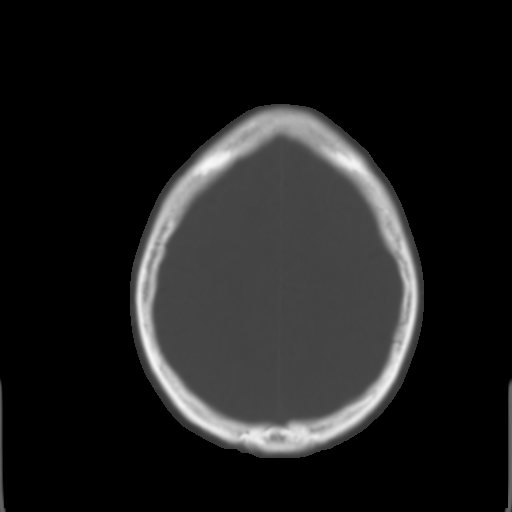
[im 27/32  brain]
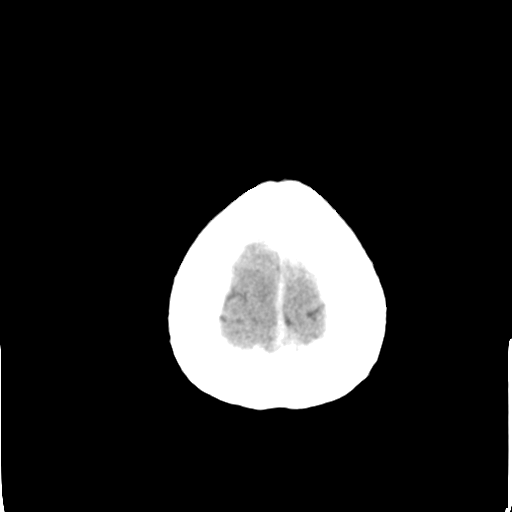

[Series 4: head bone · axial · 0.44mm/px · z∈[-138,-100]mm · 3 of 81 slices shown]
[im 8/81  bone]
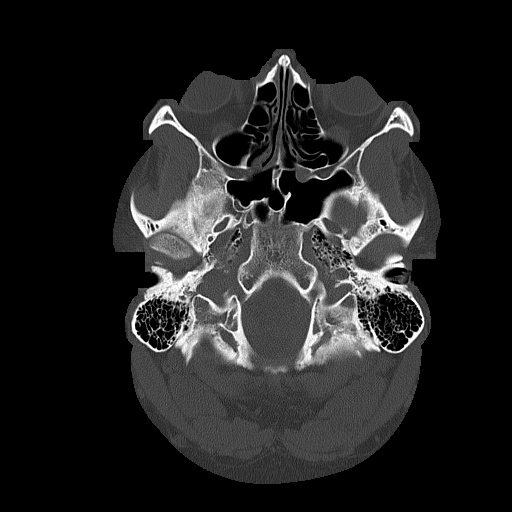
[im 16/81  bone]
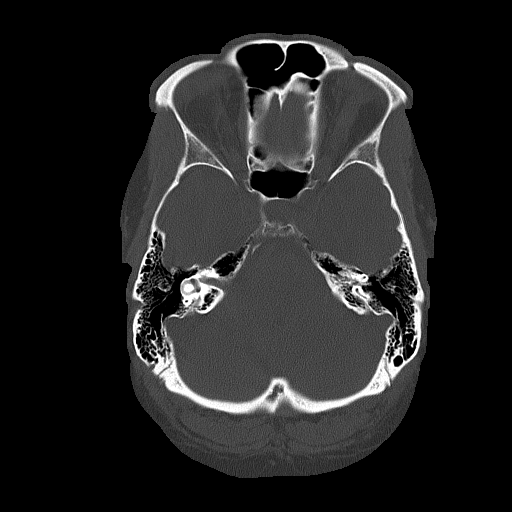
[im 27/81  bone]
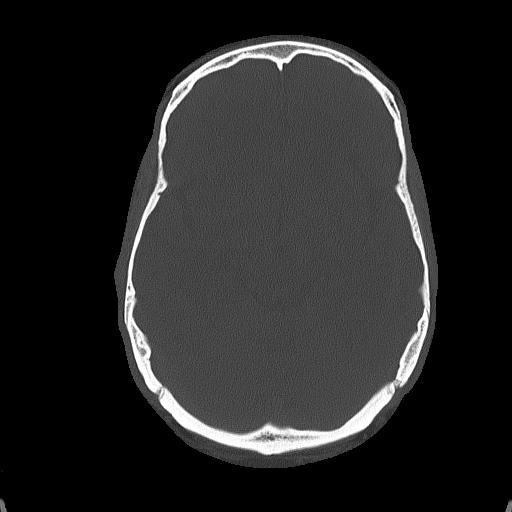

[Series 5: coronal soft tissue · coronal · 0.32mm/px · 3 of 75 slices shown]
[im 25/75  brain]
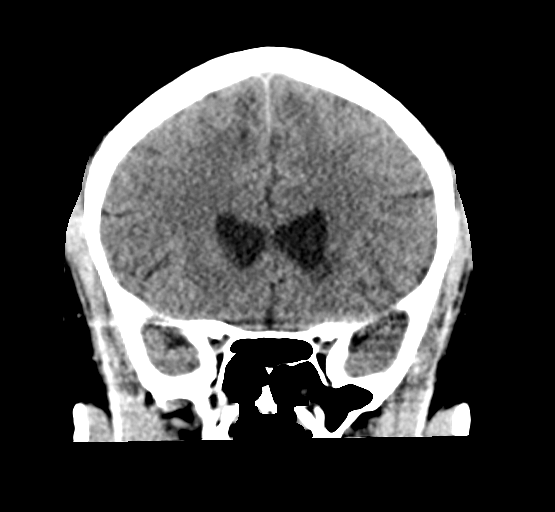
[im 33/75  brain]
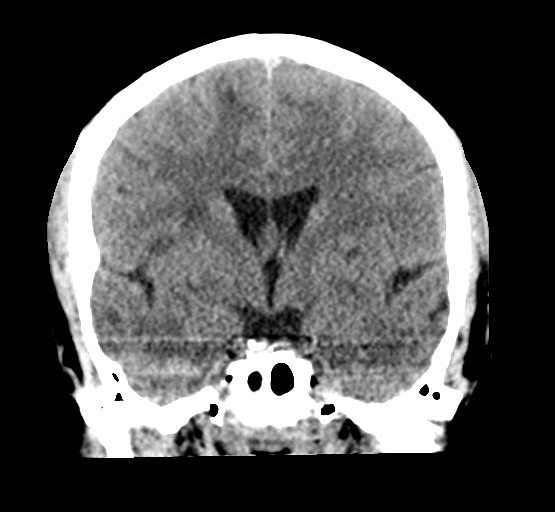
[im 42/75  brain]
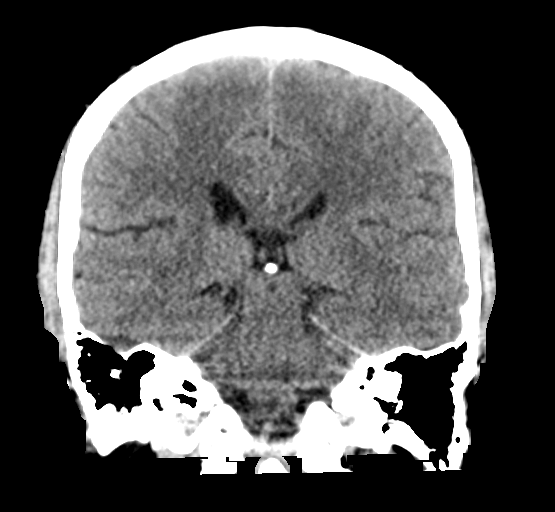

[Series 6: sagittal soft tissue · sagittal · 0.32mm/px · 3 of 60 slices shown]
[im 20/60  brain]
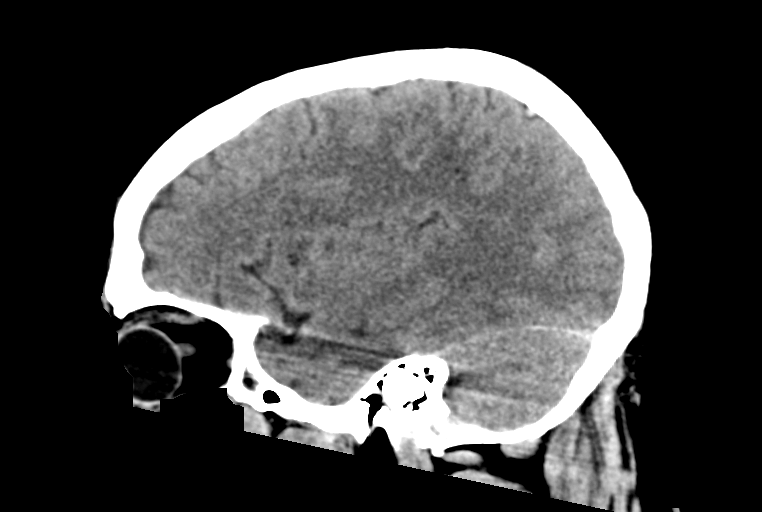
[im 30/60  brain]
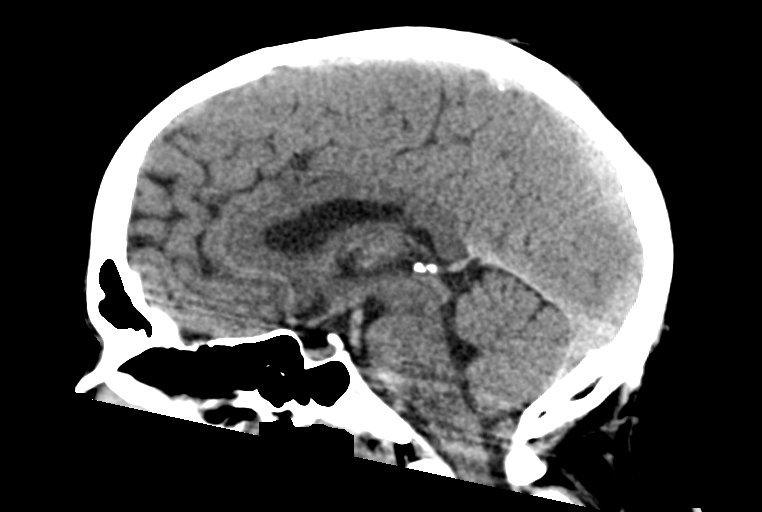
[im 40/60  brain]
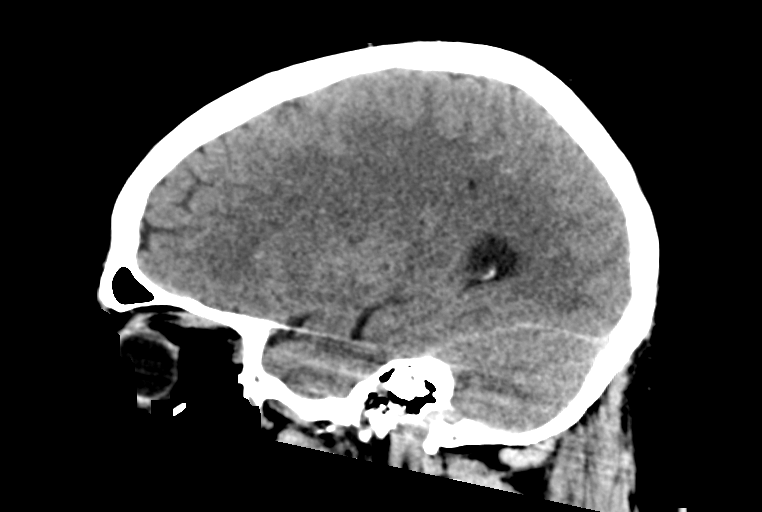

[15 of 47 positions shown; findings below may reference images not displayed]

FINDINGS: Brain: Mildly advanced cerebral volume loss for age. Few scatter
remote lacunar infarcts noted about the bilateral basal ganglia and
right thalamus. Patchy hypodensity seen involving the supratentorial
cerebral white matter, nonspecific, but most likely related chronic
microvascular ischemic disease given the presence of the lacunar
infarcts.

No acute intracranial hemorrhage. No acute large vessel territory
infarct. No mass lesion or midline shift. No hydrocephalus or
extra-axial fluid collection.

Vascular: No hyperdense vessel.

Skull: Scalp soft tissues and calvarium within normal limits.

Sinuses/Orbits: Conjugate right gaze noted. Globes and orbital soft
tissues demonstrate no other acute finding. Paranasal sinuses are
clear. No mastoid effusion.

Other: None.

ASPECTS (Alberta Stroke Program Early CT Score)

- Ganglionic level infarction (caudate, lentiform nuclei, internal
capsule, insula, M1-M3 cortex): 7

- Supraganglionic infarction (M4-M6 cortex): 3

Total score (0-10 with 10 being normal): 10
IMPRESSION: 1. No acute intracranial infarct or other abnormality.
2. ASPECTS is 10.
3. Multiple remote lacunar infarcts about the deep gray nuclei, with
probable chronic microvascular ischemic disease involving the
supratentorial cerebral white matter, advanced for age.

Critical Value/emergent results were called by telephone at the time
of interpretation on [DATE] at [DATE] to provider COPEK ,
who verbally acknowledged these results.

## 2021-03-29 MED ORDER — AMLODIPINE BESYLATE 5 MG PO TABS
5.0000 mg | ORAL_TABLET | Freq: Once | ORAL | Status: AC
  Administered 2021-03-29: 5 mg via ORAL
  Filled 2021-03-29: qty 1

## 2021-03-29 MED ORDER — HYDRALAZINE HCL 50 MG PO TABS
100.0000 mg | ORAL_TABLET | Freq: Once | ORAL | Status: AC
  Administered 2021-03-29: 100 mg via ORAL
  Filled 2021-03-29: qty 2

## 2021-03-29 MED ORDER — LORAZEPAM 2 MG/ML IJ SOLN
1.0000 mg | Freq: Once | INTRAMUSCULAR | Status: AC
  Administered 2021-03-29: 1 mg via INTRAVENOUS
  Filled 2021-03-29: qty 1

## 2021-03-29 MED ORDER — SODIUM CHLORIDE 0.9% FLUSH
3.0000 mL | Freq: Once | INTRAVENOUS | Status: AC
  Administered 2021-03-29: 3 mL via INTRAVENOUS

## 2021-03-29 MED ORDER — PREDNISONE 50 MG PO TABS: ORAL_TABLET | ORAL | 0 refills | Status: DC

## 2021-03-29 MED ORDER — VALACYCLOVIR HCL 1 G PO TABS: 1000.0000 mg | ORAL_TABLET | Freq: Two times a day (BID) | ORAL | 0 refills | Status: DC

## 2021-03-29 NOTE — ED Notes (Signed)
Patient is resting comfortably. Patient phone plugged in to charge. Patient also provided his food and drinks brought from home at his request. Lights dimmed for patient comfort as well.

## 2021-03-29 NOTE — ED Provider Notes (Signed)
[facility] Emergency Department Provider Note   ____________________________________________   Event Date/Time   First MD Initiated Contact with Patient 03/29/21 2141     (approximate)  I have reviewed the triage vital signs and the nursing notes.   HISTORY  Chief Complaint Facial Droop, blurred vission, and Hypertension    HPI Jeremy Hudson is a 40 y.o. male for evaluation of drooping of his left face.  History of hypertension chronic kidney disease  Patient reports that yesterday evening about 24 hours ago he noticed when he went to drink something that the left side of his face seemed little bit droopy.  Then today he also felt like his vision felt just a little bit fuzzy, but can still see clearly.  Somewhat hard to describe.  Also reports for several months he has felt like his right leg feels a little bit weak at times.  He has a history of hypertension and is on 4 blood pressure medicines, he took his medications this morning but has not had evening dose of hydralazine.  No nausea or vomiting.  No headache no fevers or chills.  No recent illness.  Patient reports he feels extremely anxious.  He reports that his sister died of COVID at our hospital, and he was now allowed to visit her, and it provokes a lot of anxiety for him to think about that and already has anxiety as well.  He is also caring for his sisters children now and does not have employment which she reports is also a major stressor.   Past Medical History:  Diagnosis Date  . Hypertension     Patient Active Problem List   Diagnosis Date Noted  . Kidney stone 02/21/2018  . Accelerated hypertension 02/21/2018  . AKI (acute kidney injury) ([place]) 02/21/2018  . Intractable nausea and vomiting 02/21/2018    Past Surgical History:  Procedure Laterality Date  . NO PAST SURGERIES      Prior to Admission medications   Medication Sig Start Date End Date Taking? Authorizing Provider   predniSONE (DELTASONE) 50 MG tablet 1 tab by mouth daily 03/29/21  Yes Delman Kitten, MD  valACYclovir (VALTREX) 1000 MG tablet Take 1 tablet (1,000 mg total) by mouth 2 (two) times daily for 10 days. 03/29/21 04/08/21 Yes Delman Kitten, MD  amLODipine (NORVASC) 10 MG tablet Take 1 tablet (10 mg total) by mouth daily. 02/23/18   Hillary Bow, MD  Blood Pressure KIT 1 Units by Does not apply route daily. 02/23/18   Hillary Bow, MD  hydrALAZINE (APRESOLINE) 100 MG tablet Take 1 tablet (100 mg total) by mouth every 8 (eight) hours. 02/23/18   Hillary Bow, MD  hydrochlorothiazide (HYDRODIURIL) 50 MG tablet Take 1 tablet (50 mg total) by mouth daily. 02/23/18   Hillary Bow, MD  labetalol (NORMODYNE) 100 MG tablet Take 1 tablet (100 mg total) by mouth 2 (two) times daily. 02/23/18   Hillary Bow, MD  oxyCODONE-acetaminophen (ROXICET) 5-325 MG tablet Take 1 tablet by mouth every 4 (four) hours as needed for severe pain. Patient not taking: Reported on 02/21/2018 08/26/16   Paulette Blanch, MD    Allergies Patient has no known allergies.  No family history on file.  Social History Social History   Tobacco Use  . Smoking status: Former Research scientist (life sciences)  . Smokeless tobacco: Never Used  Vaping Use  . Vaping Use: Never used  Substance Use Topics  . Alcohol use: Yes  . Drug use: Yes  Types: Marijuana    Comment: Last use today    Review of Systems Constitutional: No fever/chills Eyes: Had a little bit of blurring his vision seems clear now. ENT: No sore throat. Cardiovascular: Denies chest pain. Respiratory: Denies shortness of breath. Gastrointestinal: No abdominal pain.   Genitourinary: Negative for dysuria. Musculoskeletal: Negative for back pain. Skin: Negative for rash. Neurological: Negative for headaches.  Feels like he has a little bit of weakness or drooping over his left face and will difficulty closing left eye.    ____________________________________________   PHYSICAL  EXAM:  VITAL SIGNS: ED Triage Vitals  Enc Vitals Group     BP 03/29/21 2122 (!) 192/108     Pulse Rate 03/29/21 2122 96     Resp 03/29/21 2122 20     Temp 03/29/21 2122 98.6 F (37 C)     Temp Source 03/29/21 2122 Oral     SpO2 03/29/21 2122 98 %     Weight 03/29/21 2122 222 lb (100.7 kg)     Height 03/29/21 2122 5' 4"  (1.626 m)     Head Circumference --      Peak Flow --      Pain Score 03/29/21 2138 0     Pain Loc --      Pain Edu? --      Excl. in [place]? --     Constitutional: Alert and oriented.  Appears very anxious.  Slightly tremulous reports he feels very anxious and stressed out about having to come to the hospital. Eyes: Conjunctivae are normal. Head: Atraumatic. Nose: No congestion/rhinnorhea. Mouth/Throat: Mucous membranes are moist. Neck: No stridor.  Cardiovascular: Mildly tachycardic rate, regular rhythm. Grossly normal heart sounds.  Good peripheral circulation. Respiratory: Normal respiratory effort for mildly increased rate.  No retractions. Lungs CTAB.  Speaks in full clear sentences.  Speech is clear. Gastrointestinal: Soft and nontender. No distention. Musculoskeletal: No lower extremity tenderness nor edema. Neurologic:  Normal speech and language. No gross focal neurologic deficits are appreciated except for some very mild weakness of the left lower face, perhaps some slight weakness of the left eyelid but still able to close it.  5 out of 5 strength in all extremities.  No loss sensation over the face arms or legs.  Tongue protrudes at midline.  Extraocular movements and visual fields are normal. Skin:  Skin is warm, dry and intact. No rash noted. Psychiatric: Mood and affect are normal. Speech and behavior are normal.  ____________________________________________   LABS (all labs ordered are listed, but only abnormal results are displayed)  Labs Reviewed  CBC - Abnormal; Notable for the following components:      Result Value   WBC 11.1 (*)    All  other components within normal limits  DIFFERENTIAL - Abnormal; Notable for the following components:   Monocytes Absolute 1.1 (*)    All other components within normal limits  COMPREHENSIVE METABOLIC PANEL - Abnormal; Notable for the following components:   Potassium 3.2 (*)    BUN 25 (*)    Creatinine, Ser 2.30 (*)    Total Protein 8.5 (*)    ALT 59 (*)    GFR, Estimated 37 (*)    All other components within normal limits  CBG MONITORING, ED - Abnormal; Notable for the following components:   Glucose-Capillary 143 (*)    All other components within normal limits  CBG MONITORING, ED - Abnormal; Notable for the following components:   Glucose-Capillary 111 (*)    All  other components within normal limits  PROTIME-INR  APTT   ____________________________________________  EKG  Reviewed and interpreted at 2130 Heart rate 80 QRS 100 QTc 440 Normal sinus rhythm, left ventricular hypertrophy with repolarization abnormality. ____________________________________________  RADIOLOGY  CT HEAD CODE STROKE WO CONTRAST  Result Date: 03/29/2021 CLINICAL DATA:  Code stroke. Initial evaluation for acute blurry vision. EXAM: CT HEAD WITHOUT CONTRAST TECHNIQUE: Contiguous axial images were obtained from the base of the skull through the vertex without intravenous contrast. COMPARISON:  None. FINDINGS: Brain: Mildly advanced cerebral volume loss for age. Few scatter remote lacunar infarcts noted about the bilateral basal ganglia and right thalamus. Patchy hypodensity seen involving the supratentorial cerebral white matter, nonspecific, but most likely related chronic microvascular ischemic disease given the presence of the lacunar infarcts. No acute intracranial hemorrhage. No acute large vessel territory infarct. No mass lesion or midline shift. No hydrocephalus or extra-axial fluid collection. Vascular: No hyperdense vessel. Skull: Scalp soft tissues and calvarium within normal limits.  Sinuses/Orbits: Conjugate right gaze noted. Globes and orbital soft tissues demonstrate no other acute finding. Paranasal sinuses are clear. No mastoid effusion. Other: None. ASPECTS [facility] Stroke Program Early CT Score) - Ganglionic level infarction (caudate, lentiform nuclei, internal capsule, insula, M1-M3 cortex): 7 - Supraganglionic infarction (M4-M6 cortex): 3 Total score (0-10 with 10 being normal): 10 IMPRESSION: 1. No acute intracranial infarct or other abnormality. 2. ASPECTS is 10. 3. Multiple remote lacunar infarcts about the deep gray nuclei, with probable chronic microvascular ischemic disease involving the supratentorial cerebral white matter, advanced for age. Critical Value/emergent results were called by telephone at the time of interpretation on 03/29/2021 at 10:02 pm to provider Jovon Winterhalter , who verbally acknowledged these results. Electronically Signed   By: Jeannine Boga M.D.   On: 03/29/2021 22:06    CT head reviewed negative for acute finding.  However, multiple remote lacunar infarcts are noted. ____________________________________________   PROCEDURES  Procedure(s) performed: None  Procedures  Critical Care performed: Yes, see critical care note(s)  CRITICAL CARE Performed by: Delman Kitten   Total critical care time: 30 minutes  Critical care time was exclusive of separately billable procedures and treating other patients.  Critical care was necessary to treat or prevent imminent or life-threatening deterioration.  Critical care was time spent personally by me on the following activities: development of treatment plan with patient and/or surrogate as well as nursing, discussions with consultants, evaluation of patient's response to treatment, examination of patient, obtaining history from patient or surrogate, ordering and performing treatments and interventions, ordering and review of laboratory studies, ordering and review of radiographic studies, pulse  oximetry and re-evaluation of patient's condition.  ____________________________________________   INITIAL IMPRESSION / ASSESSMENT AND PLAN / ED COURSE  Pertinent labs & imaging results that were available during my care of the patient were reviewed by me and considered in my medical decision making (see chart for details).   Patient presents for approximately 24 hours of left facial weakness and slight difficulty with closure of the left eye.  Clinical exam seems to suggest Bell's palsy but on CT scan multiple old small lacunar type infarcts are noted.  This prompting decision to obtain MRI of the brain.  Patient was seen and evaluated in conjunction with Dr. Bridgett Larsson of neurology as well, he recommends that if MRI demonstrates acute infarct patient would be work-up and admission as I would expect.  He also advises if no acute stroke on MRI he would recommend treatment with antiviral and  prednisone treatment for presumed Bell's palsy.  I agree with this plan.  Patient has no associated chest pain or shortness of breath.  No headache.  He does have significant hypertension, but has a known established history, will provide his home hydralazine as well as Norvasc.  Patient extremely anxious as well, reports significant anxiety especially with the recent death of his sister.  Agreeable and understanding of plan to administer Ativan for anxiolysis as well.  Clinical Course as of 03/29/21 2351  Fri Mar 29, 2021  2143 Patient at ct [MQ]  2143 Telestroke rn  [MQ]    Clinical Course User Index [MQ] Delman Kitten, MD   ----------------------------------------- 11:50 PM on 03/29/2021 -----------------------------------------  Ongoing care assigned to Dr. Karma Greaser.  Follow-up on MRI of the brain.  If MRI negative, would treat as presumed Bell's palsy, if positive for stroke or other acute finding or concerning finding patient will require admission for further care and  work-up.  ____________________________________________   FINAL CLINICAL IMPRESSION(S) / ED DIAGNOSES  Final diagnoses:  Hypertension, unspecified type  Bell's palsy        Note:  This document was prepared using Dragon voice recognition software and may include unintentional dictation errors       Delman Kitten, MD 03/29/21 2351

## 2021-03-29 NOTE — ED Triage Notes (Signed)
Pt reports he has been dealing with blurred vision since yesterday reports not sure when facial droop started, pt has history of high blood pressure. Pt reports in the past couple of hrs he noticed increased vision change and slurred speech. Pt talks in complete sentences.

## 2021-03-29 NOTE — ED Notes (Signed)
Pt given blanket per request. Pt beginning to calm down some after medication.

## 2021-03-29 NOTE — ED Provider Notes (Signed)
-----------------------------------------   11:21 PM on 03/29/2021 -----------------------------------------  Assuming care from Dr. Fanny Bien.  In short, Jeremy Hudson is a 40 y.o. male with a chief complaint of facial droop.  Refer to the original H&P for additional details.  The current plan of care is to follow up MRI.  Anticipate d/c home if MRI is normal and patient remains stable.    (Note that documentation was delayed due to multiple ED patients requiring immediate care.)  MRI consistent with acute versus subacute CVA.  His symptoms started about 24 hours ago and he is outside the window for tPA.  I updated him about the results and he said he actually feels a little bit better but he still has noticeable facial droop.  He understands the need to be admitted to the hospital for further evaluation and treatment.  He passed the stroke swallow screen without difficulty and I ordered full dose aspirin.  I discussed the case by phone with Dr. Allena Katz with the hospitalist service and he will admit.  Of note, I reassessed the patient and he continues to have an NIH stroke scale of about 4 as indicated on his prior neurology assessment.  He is in no distress currently.  He was given a dose of antihypertensive medication earlier and I will allow his blood pressure to remain elevated now that we know he has had a CVA ("permissive hypertension").   .Critical Care Performed by: Loleta Rose, MD Authorized by: Loleta Rose, MD   Critical care provider statement:    Critical care time (minutes):  30   Critical care time was exclusive of:  Separately billable procedures and treating other patients   Critical care was necessary to treat or prevent imminent or life-threatening deterioration of the following conditions:  CNS failure or compromise   Critical care was time spent personally by me on the following activities:  Development of treatment plan with patient or surrogate, discussions with  consultants, evaluation of patient's response to treatment, examination of patient, obtaining history from patient or surrogate, ordering and performing treatments and interventions, ordering and review of laboratory studies, ordering and review of radiographic studies, pulse oximetry, re-evaluation of patient's condition and review of old charts   Final diagnoses:  Hypertension, unspecified type  Acute CVA (cerebrovascular accident) [facility])      Loleta Rose, MD 03/30/21 [id number]

## 2021-03-29 NOTE — ED Notes (Signed)
Pt transported to CT by this RN.

## 2021-03-29 NOTE — ED Notes (Signed)
Pt in MRI.

## 2021-03-29 NOTE — Consult Note (Signed)
[facility] Consult Services   Date of Service:   03/29/2021 21:43:35  Diagnosis:     .  G51.0 - Bell palsy  Impression:     . The patient is a 40 year old man with a history of hypertension who presents with left face droop and right eye blurry vision. Facial weakness consistent with Bell's palsy. Blurry vision nonspecific, no clear hemianopsia seen. However, given various complains, reasonable to evaluate further with MRI brain without contrast. If positive for infarct, advise further evaluation of cerebrovascular risk factors. If negative, recommend prednisone 60 mg QD x 7 days and valcyclovir 1000 mg TID x 1 week. Eye care with saline drops and covering eye at night advised. Follow up PMD as outpatient to monitor clinical course.  Metrics: Last Known Well: Unknown [facility] Notification Time: 03/29/2021 21:43:35 Arrival Time: 03/29/2021 20:37:00 Stamp Time: 03/29/2021 21:43:35 Initial Response Time: 03/29/2021 21:45:10 Symptoms: left face droop, right eye blurry vision. NIHSS Start Assessment Time: 03/29/2021 21:49:21 Patient is not a candidate for Thrombolytic. Thrombolytic Medical Decision: 03/29/2021 21:50:25 Patient was not deemed candidate for Thrombolytic because of following reasons: Last Well Known Above 4.5 Hours.  CT head showed no acute hemorrhage or acute core infarct. CT head was reviewed.  ED Physician notified of diagnostic impression and management plan on 03/29/2021 21:59:39  Advanced Imaging: Advanced Imaging Not Recommended because:  Clinical presentation is not suggestion of LVO or Low clinical suspicion of LVO based on presentation   Our recommendations are outlined below.  Recommendations:      .  Stroke/Telemetry Floor     .  Neuro Checks     .  Bedside Swallow Eval     .  DVT Prophylaxis     .  IV Fluids, Normal Saline     .  Head of Bed 30 Degrees     .  Euglycemia and Avoid Hyperthermia (PRN  Acetaminophen)   Sign Out:     .  Discussed with Emergency Department Provider    ------------------------------------------------------------------------------  History of Present Illness: Patient is a 40 year old Male.  Patient was brought by private transportation with symptoms of left face droop, right eye blurry vision.  Patient first noted facial droop on left side last night when he drank something and fluid dribbled out of his mouth. This evening noted blurry vision in right eye. On exam, patient has upper and lower face weakness on left. No clear hemianopsia. Also reports subjective right leg weakness only noted during the exam, no clear drift.   Past Medical History:     . Hypertension  Anticoagulant use:  No  Antiplatelet use: No  Allergies:  Reviewed,NKDA    Examination: BP(192/108), Pulse(84), Blood Glucose(111) 1A: Level of Consciousness - Alert; keenly responsive + 0 1B: Ask Month and Age - Both Questions Right + 0 1C: Blink Eyes & Squeeze Hands - Performs Both Tasks + 0 2: Test Horizontal Extraocular Movements - Normal + 0 3: Test Visual Fields - No Visual Loss + 0 4: Test Facial Palsy (Use Grimace if Obtunded) - Unilateral Complete paralysis (upper/lower face) + 3 5A: Test Left Arm Motor Drift - No Drift for 10 Seconds + 0 5B: Test Right Arm Motor Drift - No Drift for 10 Seconds + 0 6A: Test Left Leg Motor Drift - No Drift for 5 Seconds + 0 6B: Test Right Leg Motor Drift - No Drift for 5 Seconds + 0 7: Test Limb Ataxia (FNF/Heel-Shin) - No Ataxia +  0 8: Test Sensation - Mild-Moderate Loss: Less Sharp/More Dull + 1 9: Test Language/Aphasia - Normal; No aphasia + 0 10: Test Dysarthria - Normal + 0 11: Test Extinction/Inattention - No abnormality + 0  NIHSS Score: 4  NIHSS Free Text : subjective weakness right leg  Pre-Morbid Modified Rankin Scale: 0 Points = No symptoms at all   Patient/Family was informed the Neurology Consult would occur via  TeleHealth consult by way of interactive audio and video telecommunications and consented to receiving care in this manner.   Patient is being evaluated for possible acute neurologic impairment and high probability of imminent or life-threatening deterioration. I spent total of 20 minutes providing care to this patient, including time for face to face visit via telemedicine, review of medical records, imaging studies and discussion of findings with providers, the patient and/or family.   Dr Rosanne Ashing   [facility] [phone]  Case [id number]

## 2021-03-29 NOTE — ED Notes (Signed)
Pt reports facial droop that began yesterday. Blurred vision primarily in right eye that began tonight after eating steak and cheese rolls that he states raised his blood pressure. Pt reports having to hold on to the wall to support himself but is able to ambulate without assistance. Pt speech clear with no receptive or expressive aphasia. Pt alert and oriented x4.

## 2021-03-30 ENCOUNTER — Observation Stay: Payer: Self-pay

## 2021-03-30 ENCOUNTER — Observation Stay (HOSPITAL_COMMUNITY)
Admit: 2021-03-30 | Discharge: 2021-03-30 | Disposition: A | Discharge status: Home / Self Care | Payer: Self-pay | Attending: Internal Medicine | Admitting: Internal Medicine

## 2021-03-30 ENCOUNTER — Other Ambulatory Visit: Payer: Self-pay

## 2021-03-30 ENCOUNTER — Encounter: Payer: Self-pay | Admitting: Internal Medicine

## 2021-03-30 DIAGNOSIS — I639 Cerebral infarction, unspecified: Secondary | ICD-10-CM | POA: Diagnosis present

## 2021-03-30 DIAGNOSIS — I6389 Other cerebral infarction: Secondary | ICD-10-CM

## 2021-03-30 DIAGNOSIS — N183 Chronic kidney disease, stage 3 unspecified: Secondary | ICD-10-CM | POA: Diagnosis present

## 2021-03-30 LAB — ECHOCARDIOGRAM COMPLETE
AR max vel: 2.39 cm2
AV Peak grad: 12.3 mmHg
Ao pk vel: 1.75 m/s
Area-P 1/2: 3.05 cm2
Height: 63 in
S' Lateral: 3.12 cm
Weight: 3594.38 oz

## 2021-03-30 LAB — RESP PANEL BY RT-PCR (FLU A&B, COVID) ARPGX2
Influenza A by PCR: NEGATIVE
Influenza B by PCR: NEGATIVE
SARS Coronavirus 2 by RT PCR: NEGATIVE

## 2021-03-30 LAB — URINE DRUG SCREEN, QUALITATIVE (ARMC ONLY)
Amphetamines, Ur Screen: NOT DETECTED
Barbiturates, Ur Screen: NOT DETECTED
Benzodiazepine, Ur Scrn: NOT DETECTED
Cannabinoid 50 Ng, Ur ~~LOC~~: POSITIVE — AB
Cocaine Metabolite,Ur ~~LOC~~: NOT DETECTED
MDMA (Ecstasy)Ur Screen: NOT DETECTED
Methadone Scn, Ur: NOT DETECTED
Opiate, Ur Screen: NOT DETECTED
Phencyclidine (PCP) Ur S: NOT DETECTED
Tricyclic, Ur Screen: NOT DETECTED

## 2021-03-30 LAB — URINALYSIS, ROUTINE W REFLEX MICROSCOPIC
Bilirubin Urine: NEGATIVE
Glucose, UA: NEGATIVE mg/dL
Hgb urine dipstick: NEGATIVE
Ketones, ur: NEGATIVE mg/dL
Leukocytes,Ua: NEGATIVE
Nitrite: NEGATIVE
Protein, ur: NEGATIVE mg/dL
Specific Gravity, Urine: 1.01 (ref 1.005–1.030)
pH: 6 (ref 5.0–8.0)

## 2021-03-30 LAB — CBC
HCT: 39.4 % (ref 39.0–52.0)
Hemoglobin: 13.8 g/dL (ref 13.0–17.0)
MCH: 27.6 pg (ref 26.0–34.0)
MCHC: 35 g/dL (ref 30.0–36.0)
MCV: 78.8 fL — ABNORMAL LOW (ref 80.0–100.0)
Platelets: 366 10*3/uL (ref 150–400)
RBC: 5 MIL/uL (ref 4.22–5.81)
RDW: 14.5 % (ref 11.5–15.5)
WBC: 10.8 10*3/uL — ABNORMAL HIGH (ref 4.0–10.5)
nRBC: 0 % (ref 0.0–0.2)

## 2021-03-30 LAB — BASIC METABOLIC PANEL
Anion gap: 10 (ref 5–15)
BUN: 26 mg/dL — ABNORMAL HIGH (ref 6–20)
CO2: 17 mmol/L — ABNORMAL LOW (ref 22–32)
Calcium: 9.4 mg/dL (ref 8.9–10.3)
Chloride: 109 mmol/L (ref 98–111)
Creatinine, Ser: 2.15 mg/dL — ABNORMAL HIGH (ref 0.61–1.24)
GFR, Estimated: 40 mL/min — ABNORMAL LOW (ref >60)
Glucose, Bld: 108 mg/dL — ABNORMAL HIGH (ref 70–99)
Potassium: 2.6 mmol/L — CL (ref 3.5–5.1)
Sodium: 136 mmol/L (ref 135–145)

## 2021-03-30 LAB — LIPID PANEL
Cholesterol: 189 mg/dL (ref 0–200)
HDL: 28 mg/dL — ABNORMAL LOW (ref >40)
LDL Cholesterol: 101 mg/dL — ABNORMAL HIGH (ref 0–99)
Total CHOL/HDL Ratio: 6.8 RATIO
Triglycerides: 302 mg/dL — ABNORMAL HIGH (ref <150)
VLDL: 60 mg/dL — ABNORMAL HIGH (ref 0–40)

## 2021-03-30 LAB — MAGNESIUM: Magnesium: 1.9 mg/dL (ref 1.7–2.4)

## 2021-03-30 LAB — HIV ANTIBODY (ROUTINE TESTING W REFLEX): HIV Screen 4th Generation wRfx: NONREACTIVE

## 2021-03-30 LAB — ETHANOL: Alcohol, Ethyl (B): <10 mg/dL (ref <10)

## 2021-03-30 IMAGING — MR MR MRA NECK WO/W CM
3 of 4 series · 28 of 48 positions shown · IV contrast (10ml Gadavist)
Comparison: MRI head [DATE].

CLINICAL DATA: Stroke follow-up.

EXAM:
MRA NECK WITHOUT AND WITH CONTRAST
MRA HEAD WITHOUT CONTRAST
TECHNIQUE: Multiplanar and multiecho pulse sequences of the neck were obtained
without and with intravenous contrast. Angiographic images of the
neck were obtained using MRA technique without and with intravenous
contrast; Angiographic images of the Circle of Willis were obtained
using MRA technique without intravenous contrast.
CONTRAST:  10mL GADAVIST GADOBUTROL 1 MMOL/ML IV SOLN

[Series 13: angio_fl3d_cor_pre_ttc=2.0s · coronal · 0.9mm · 0.85mm/px · 10 of 80 slices shown]
[im 1/80]
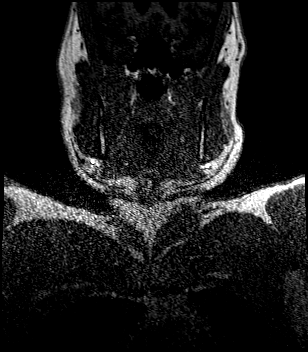
[im 9/80]
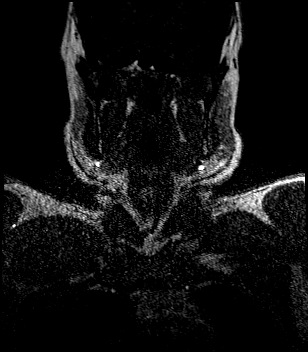
[im 18/80]
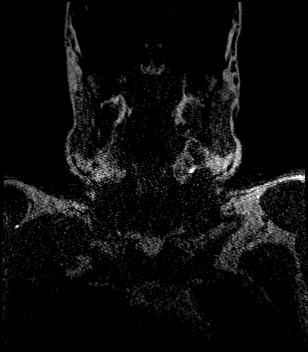
[im 27/80]
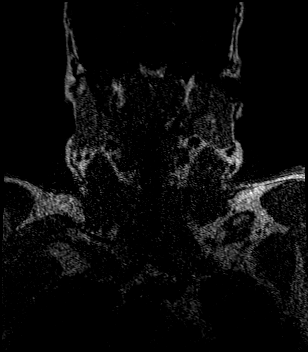
[im 36/80]
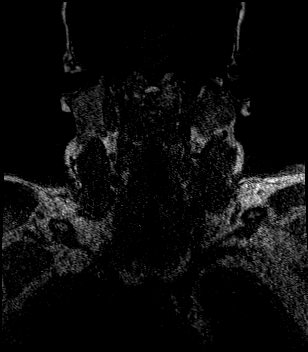
[im 44/80]
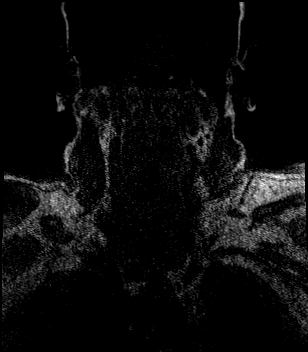
[im 53/80]
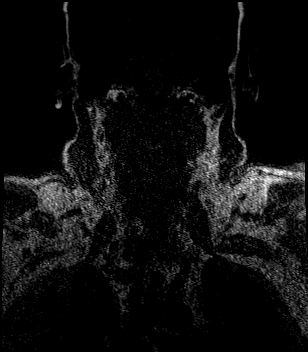
[im 62/80]
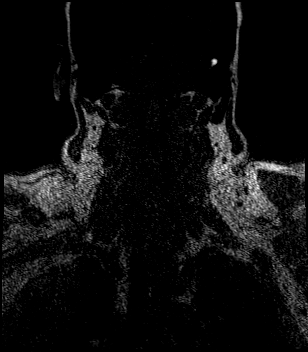
[im 71/80]
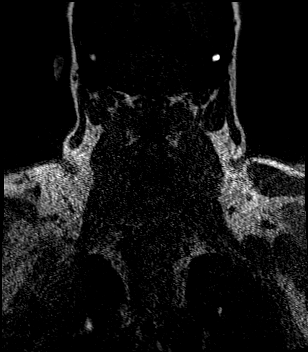
[im 80/80]
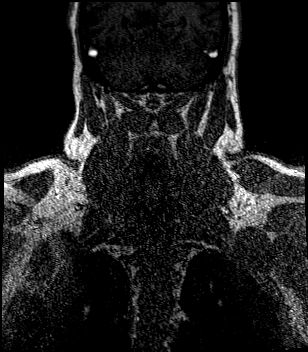

[Series 16: angio_fl3d_cor_post_ttc=2.0s_moco-adv · coronal · 0.9mm · 0.85mm/px · 9 of 69 slices shown]
[im 1/69]
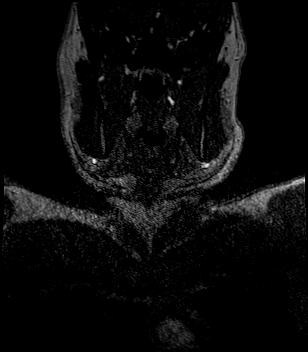
[im 9/69]
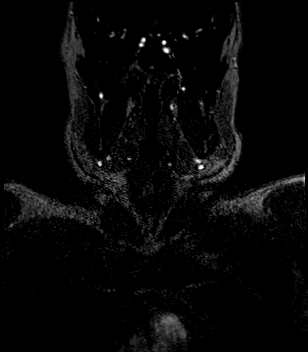
[im 18/69]
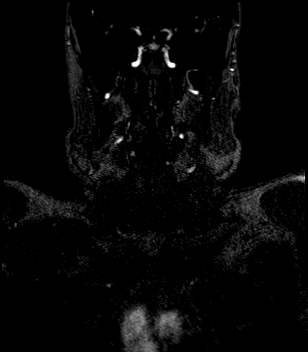
[im 26/69]
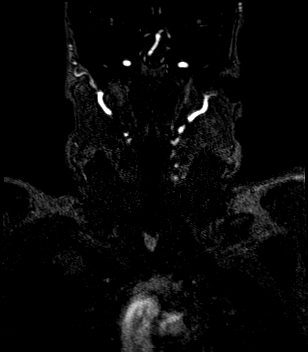
[im 35/69]
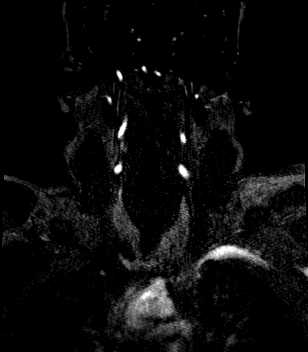
[im 43/69]
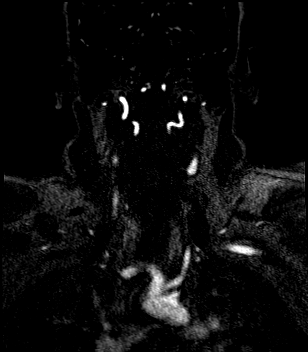
[im 52/69]
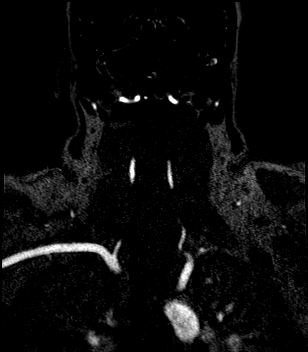
[im 60/69]
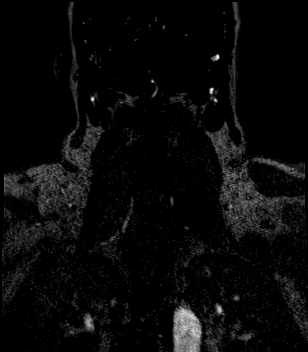
[im 69/69]
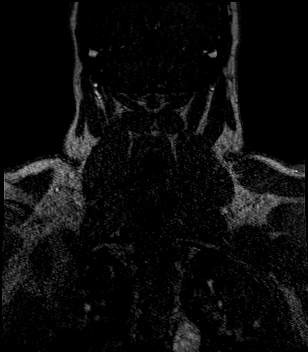

[Series 17: angio_fl3d_cor_post_ttc=2.0s_moco-adv_sub · coronal · 0.9mm · 0.85mm/px · 9 of 72 slices shown]
[im 1/72]
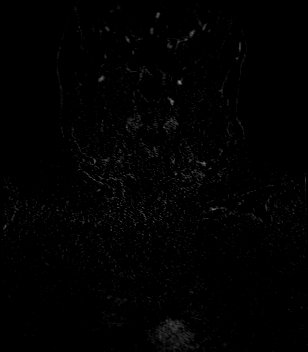
[im 9/72]
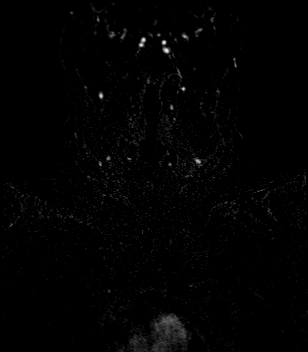
[im 18/72]
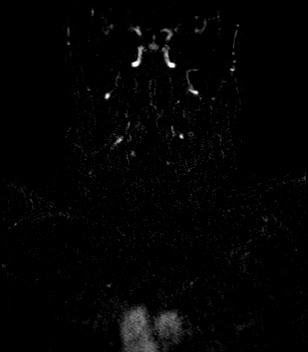
[im 27/72]
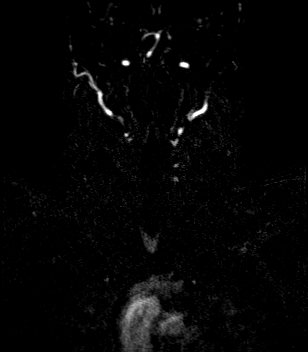
[im 36/72]
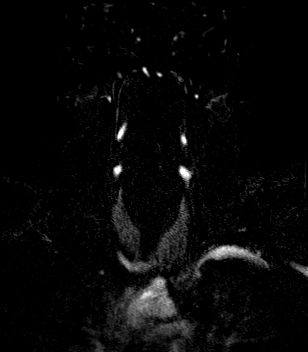
[im 45/72]
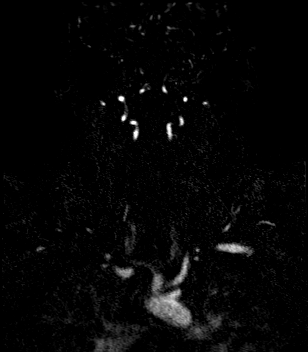
[im 54/72]
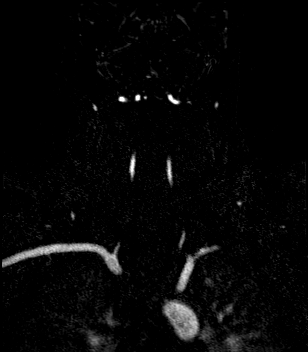
[im 63/72]
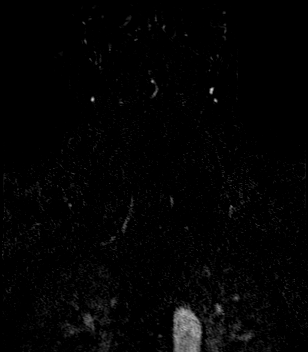
[im 72/72]
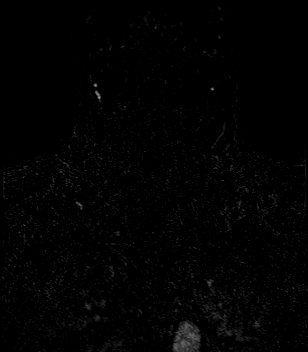

[28 of 48 positions shown; findings below may reference images not displayed]

FINDINGS: MRA NECK FINDINGS

Aorta: Great vessel origins are he tends.

Carotids: No evidence of significant (greater than 50%) stenosis.

Vertebral arteries: Codominant. No evidence of significant (greater
than 50%) stenosis.

MRA HEAD FINDINGS

Anterior circulation: No large vessel occlusion or hemodynamically
significant proximal stenosis. Small (1-2 mm) outpouching arising
from the left paraclinoid ICA (series 1, image 22 and series [5Y]
image 171).

Posterior circulation: Bilateral intradural vertebral arteries,
basilar artery, and the posterior cerebral arteries are patent
without evidence of proximal hemodynamically significant stenosis.
Mild narrowing of the distal basilar artery and right P2 PCA.
IMPRESSION: 1. No large vessel occlusion or hemodynamically significant stenosis
in the head or neck.
2. Small (1-2 mm) outpouching arising from the left paraclinoid ICA,
which may represent a small aneurysm versus infundibulum with vessel
too small to characterize by MRA. A follow-up CTA could further
characterize.

## 2021-03-30 IMAGING — MR MR MRA HEAD W/O CM
1 series · 19 of 48 positions shown · IV contrast (gadavist)
Comparison: MRI head [DATE].

CLINICAL DATA: Stroke follow-up.

EXAM:
MRA NECK WITHOUT AND WITH CONTRAST
MRA HEAD WITHOUT CONTRAST
TECHNIQUE: Multiplanar and multiecho pulse sequences of the neck were obtained
without and with intravenous contrast. Angiographic images of the
neck were obtained using MRA technique without and with intravenous
contrast; Angiographic images of the Circle of Willis were obtained
using MRA technique without intravenous contrast.
CONTRAST:  10mL GADAVIST GADOBUTROL 1 MMOL/ML IV SOLN

[Series 1: TOF · axial · 0.5mm · 0.41mm/px · z∈[-98,-1]mm · 19 of 205 slices shown]
[im 1/205]
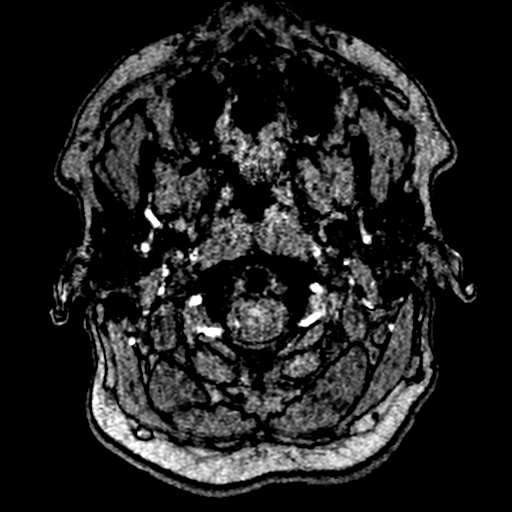
[im 5/205]
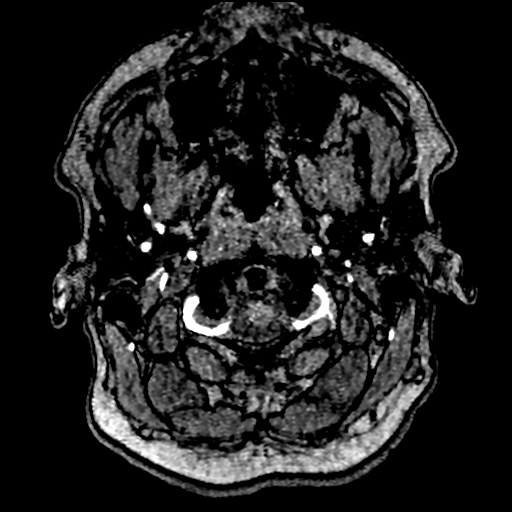
[im 9/205]
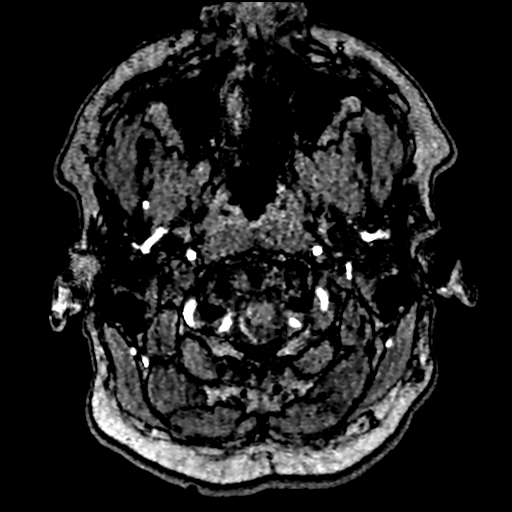
[im 14/205]
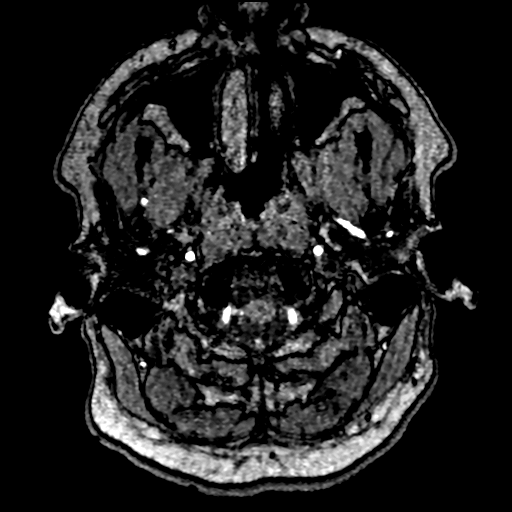
[im 18/205]
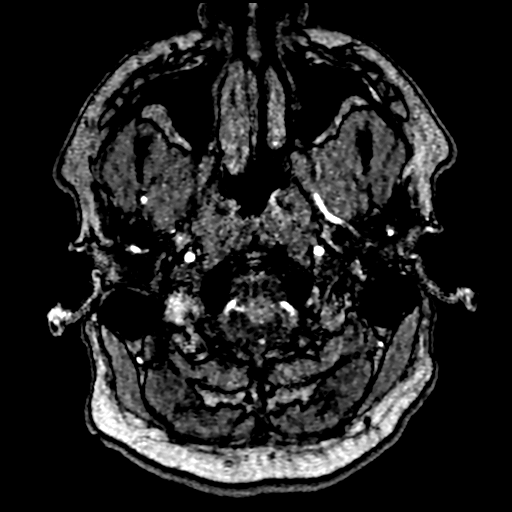
[im 22/205]
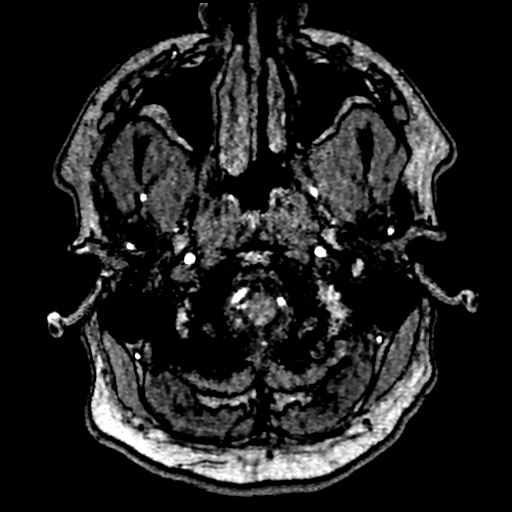
[im 27/205]
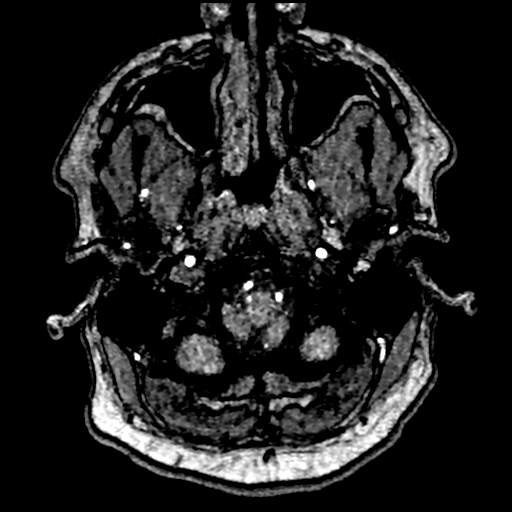
[im 31/205]
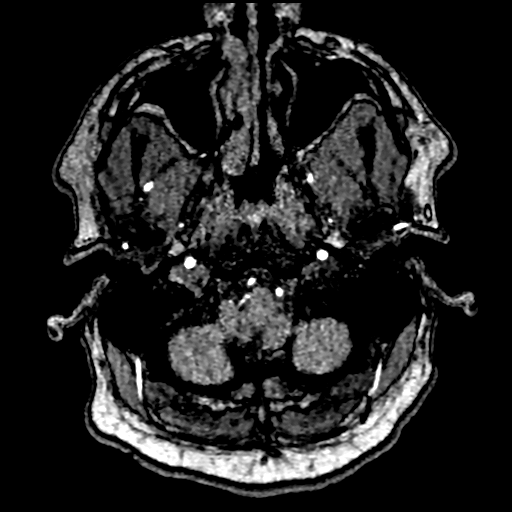
[im 35/205]
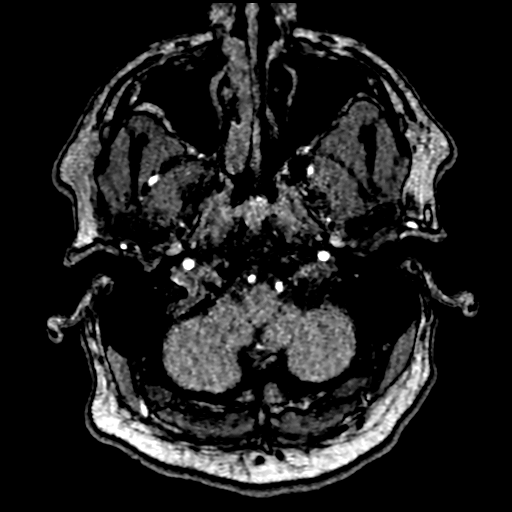
[im 40/205]
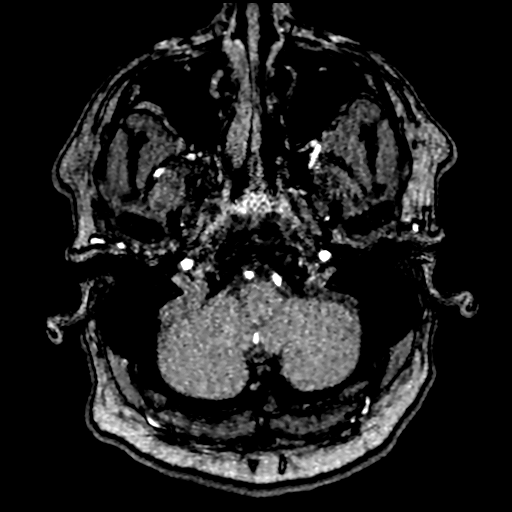
[im 44/205]
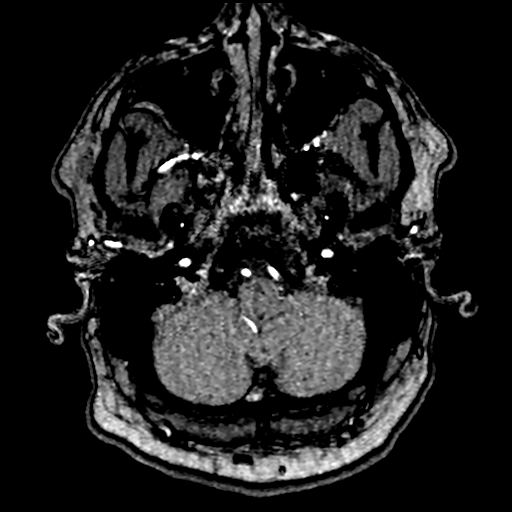
[im 66/205]
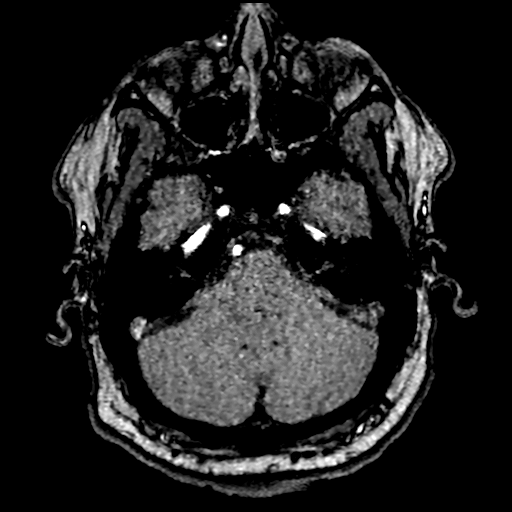
[im 92/205]
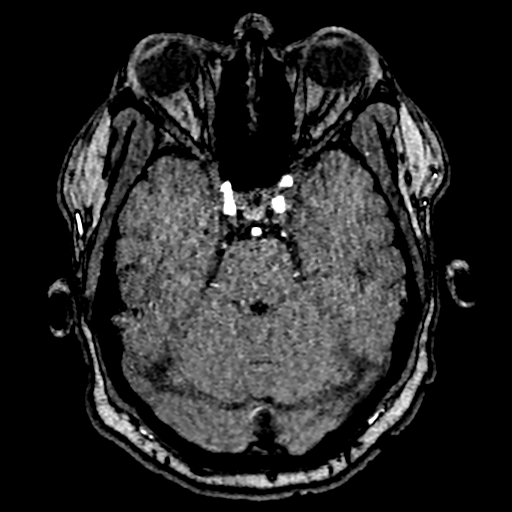
[im 105/205]
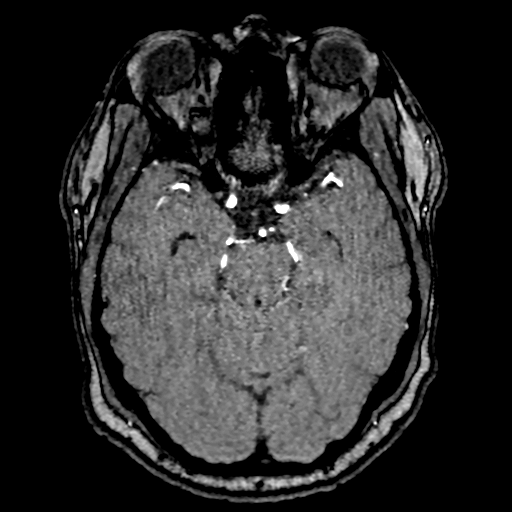
[im 118/205]
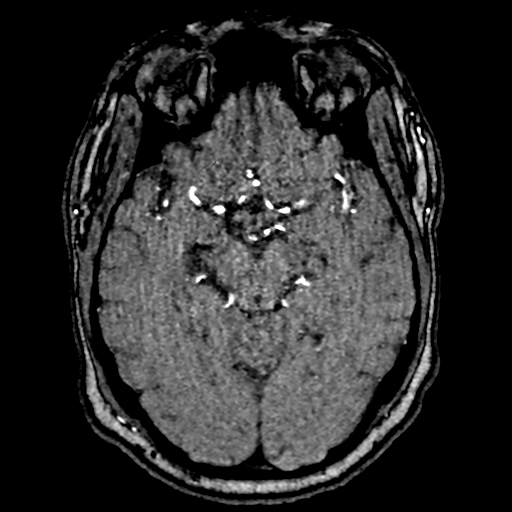
[im 144/205]
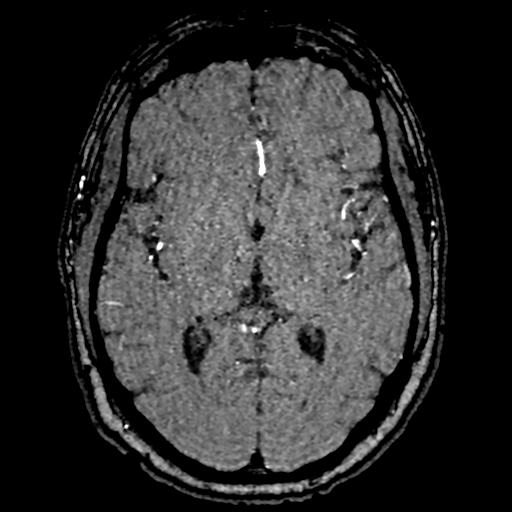
[im 170/205]
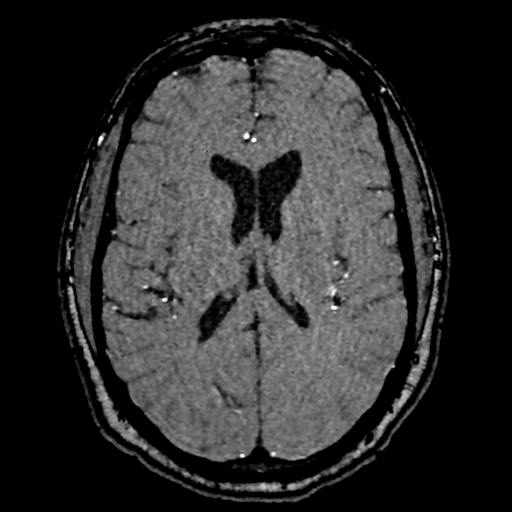
[im 174/205]
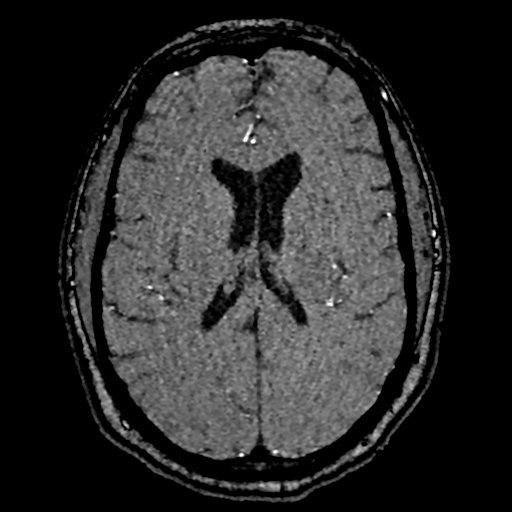
[im 196/205]
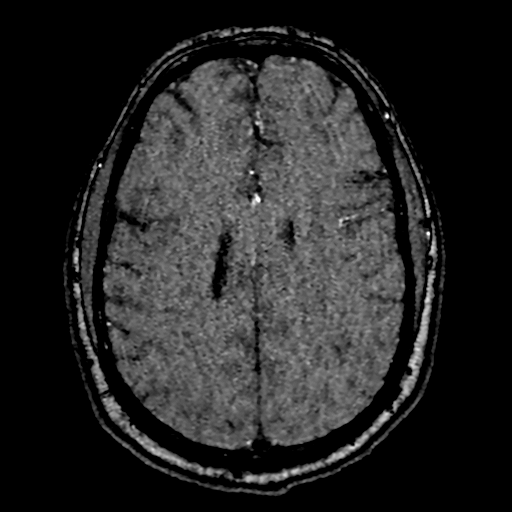

[19 of 48 positions shown; findings below may reference images not displayed]

FINDINGS: MRA NECK FINDINGS

Aorta: Great vessel origins are he tends.

Carotids: No evidence of significant (greater than 50%) stenosis.

Vertebral arteries: Codominant. No evidence of significant (greater
than 50%) stenosis.

MRA HEAD FINDINGS

Anterior circulation: No large vessel occlusion or hemodynamically
significant proximal stenosis. Small (1-2 mm) outpouching arising
from the left paraclinoid ICA (series 1, image 22 and series [5Y]
image 171).

Posterior circulation: Bilateral intradural vertebral arteries,
basilar artery, and the posterior cerebral arteries are patent
without evidence of proximal hemodynamically significant stenosis.
Mild narrowing of the distal basilar artery and right P2 PCA.
IMPRESSION: 1. No large vessel occlusion or hemodynamically significant stenosis
in the head or neck.
2. Small (1-2 mm) outpouching arising from the left paraclinoid ICA,
which may represent a small aneurysm versus infundibulum with vessel
too small to characterize by MRA. A follow-up CTA could further
characterize.

## 2021-03-30 MED ORDER — SODIUM CHLORIDE 0.9 % IV SOLN: INTRAVENOUS | Status: AC

## 2021-03-30 MED ORDER — ACETAMINOPHEN 325 MG PO TABS
650.0000 mg | ORAL_TABLET | ORAL | Status: DC | PRN
  Administered 2021-03-31 (×2): 650 mg via ORAL
  Filled 2021-03-30 (×2): qty 2

## 2021-03-30 MED ORDER — ACETAMINOPHEN 650 MG RE SUPP: 650.0000 mg | RECTAL | Status: DC | PRN

## 2021-03-30 MED ORDER — CLOPIDOGREL BISULFATE 75 MG PO TABS
75.0000 mg | ORAL_TABLET | Freq: Every day | ORAL | Status: DC
  Administered 2021-03-30 – 2021-04-01 (×3): 75 mg via ORAL
  Filled 2021-03-30 (×3): qty 1

## 2021-03-30 MED ORDER — ACETAMINOPHEN 160 MG/5ML PO SOLN
650.0000 mg | ORAL | Status: DC | PRN
  Filled 2021-03-30: qty 20.3

## 2021-03-30 MED ORDER — HYDROXYZINE HCL 25 MG PO TABS
25.0000 mg | ORAL_TABLET | Freq: Four times a day (QID) | ORAL | Status: DC | PRN
  Administered 2021-03-30: 25 mg via ORAL
  Filled 2021-03-30 (×2): qty 1

## 2021-03-30 MED ORDER — STROKE: EARLY STAGES OF RECOVERY BOOK: Freq: Once | Status: AC

## 2021-03-30 MED ORDER — POTASSIUM CHLORIDE 10 MEQ/100ML IV SOLN
10.0000 meq | INTRAVENOUS | Status: AC
  Administered 2021-03-30: 10 meq via INTRAVENOUS
  Filled 2021-03-30 (×3): qty 100

## 2021-03-30 MED ORDER — HEPARIN SODIUM (PORCINE) 5000 UNIT/ML IJ SOLN
5000.0000 [IU] | Freq: Three times a day (TID) | INTRAMUSCULAR | Status: DC
  Administered 2021-03-30 – 2021-04-01 (×7): 5000 [IU] via SUBCUTANEOUS
  Filled 2021-03-30 (×7): qty 1

## 2021-03-30 MED ORDER — POTASSIUM CHLORIDE CRYS ER 20 MEQ PO TBCR
40.0000 meq | EXTENDED_RELEASE_TABLET | Freq: Once | ORAL | Status: AC
  Administered 2021-03-30: 08:00:00 40 meq via ORAL
  Filled 2021-03-30: qty 2

## 2021-03-30 MED ORDER — GADOBUTROL 1 MMOL/ML IV SOLN
10.0000 mL | Freq: Once | INTRAVENOUS | Status: AC | PRN
  Administered 2021-03-30: 10 mL via INTRAVENOUS

## 2021-03-30 MED ORDER — LABETALOL HCL 5 MG/ML IV SOLN
10.0000 mg | INTRAVENOUS | Status: DC | PRN
  Administered 2021-03-30 – 2021-03-31 (×5): 10 mg via INTRAVENOUS
  Filled 2021-03-30 (×4): qty 4

## 2021-03-30 MED ORDER — SENNOSIDES-DOCUSATE SODIUM 8.6-50 MG PO TABS
1.0000 | ORAL_TABLET | Freq: Every evening | ORAL | Status: DC | PRN
  Administered 2021-03-30 – 2021-03-31 (×2): 1 via ORAL
  Filled 2021-03-30 (×2): qty 1

## 2021-03-30 MED ORDER — ASPIRIN 81 MG PO CHEW
324.0000 mg | CHEWABLE_TABLET | Freq: Once | ORAL | Status: AC
  Administered 2021-03-30: 324 mg via ORAL
  Filled 2021-03-30: qty 4

## 2021-03-30 MED ORDER — ASPIRIN EC 81 MG PO TBEC
81.0000 mg | DELAYED_RELEASE_TABLET | Freq: Every day | ORAL | Status: DC
  Administered 2021-03-30 – 2021-04-01 (×3): 81 mg via ORAL
  Filled 2021-03-30 (×3): qty 1

## 2021-03-30 NOTE — ED Notes (Signed)
Patient transported to unit by Corrie Dandy, RN.

## 2021-03-30 NOTE — Progress Notes (Addendum)
Neurology Progress Note  Subjective: - NAEON - Pt reports resolution of all sx except L facial droop - Does not feel dizzy, was able to ambulate this morning - No neurologic complaints today  Exam: Vitals:   03/31/21 1030 03/31/21 1252  BP: (!) 168/118 (!) 176/123  Pulse:  (!) 108  Resp:  (!) 25  Temp: 99.5 F (37.5 C) 99.6 F (37.6 C)  SpO2:  100%   Gen: In bed, NAD Resp: non-labored breathing, no acute distress Abd: soft, nt  Neuro: MS: A&Ox4 MW:UXLKG, EOMI, sensation intact, L UMN facial droop, hearing intact to voice Motor: 5/5 throughout Sensory:SILT DTR: 2+ and symmetric throughout  Data  MRI brain showing acute ischemic nonhemorrhagic infarcts involving the left paramedian pons and posterior left lentiform nucleus.    MRA H&N 1. No large vessel occlusion or hemodynamically significant stenosis in the head or neck. 2. Small (1-2 mm) outpouching arising from the left paraclinoid ICA, which may represent a small aneurysm versus infundibulum with vessel too small to characterize by MRA. A follow-up CTA could further Characterize.  TTE 1. Left ventricular ejection fraction, by estimation, is 60 to 65%. The left ventricle has normal function. The left ventricle has no regional wall motion abnormalities. There is moderate left ventricular hypertrophy. Left ventricular diastolic parameters are consistent with Grade I diastolic dysfunction (impaired relaxation). The average left ventricular global longitudinal strain is -17.3 %. The global longitudinal strain is normal. 2. Right ventricular systolic function is normal. The right ventricular size is normal. Tricuspid regurgitation signal is inadequate for assessing PA pressure. 3. The mitral valve is normal in structure. Mild mitral valve regurgitation.  LDL 101  A1c pending, but accuchecks here have not been elevated  Impression: 40 yo gentleman with hx HTN and HL admitted for small pontine ischemic stroke with  resolution of sx except L facial droop  Recommendations: 1) stroke w/u completed 2) Continue on ASA 81mg  daily and plavix 75mg  daily x21 days f/b ASA 81mg  daily monotherapy after that 3) Atorvastatin 40mg  daily 4) I will refer to neurology for f/u for further evaluation of possible 1-69mm vessel outpouching seen on MRA H&N. This would not be intervenable at this time and given the current severe shortage of iodinated contrast this can be deferred to the outpatient setting 5) Outpatient neurologist may consider hypercoag w/u given stroke in patient <40; I will defer this to outpatient setting bc studies ordered here would not result prior to d/c and should be followed up on by neurologist  Neurology to sign off, but please re-engage if additional questions arise.   , MD [facility] [phone]  If 7pm- 7am, please page neurology on call as listed in AMION.

## 2021-03-30 NOTE — Evaluation (Signed)
Speech Language Pathology Evaluation Patient Details Name: Jeremy Hudson MRN: [id number] DOB: September 29, 1981 Today's Date: 03/30/2021 Time: 1150-1230 SLP Time Calculation (min) (ACUTE ONLY): 40 min  Problem List:  Patient Active Problem List   Diagnosis Date Noted  . Acute CVA (cerebrovascular accident) (HCC) 03/30/2021  . CKD (chronic kidney disease), stage III (HCC)   . Kidney stone 02/21/2018  . Hypertension 02/21/2018  . AKI (acute kidney injury) (HCC) 02/21/2018  . Intractable nausea and vomiting 02/21/2018   Past Medical History:  Past Medical History:  Diagnosis Date  . Hypertension    Past Surgical History:  Past Surgical History:  Procedure Laterality Date  . NO PAST SURGERIES     HPI:  Pt is a 40 y.o. male with medical history significant for hypertension, CKD stage III, anxiety who presents to the ED for evaluation of left facial droop and right blurry vision.     Patient noticed sometime on 5/26 that when he was trying to drink something the liquid was drilling out the left side of his mouth.  He then realized his left face was drooping.  On 5/27 he noticed that his vision was a little bit funny in his right eye.  He also noticed some numbness in his right lower leg and felt as if he was having some trouble walking due to feeling off balance.  MRI revealed - MRI brain was obtained and showed a 1.2 cm acute ischemic nonhemorrhagic infarct involving the left paramedian pons and additional 6 mm acute ischemic nonhemorrhagic infarct at the posterior left lentiform nucleus. Multiple remote lacunar infarcts about the deep gray nuclei with probable chronic microvascular ischemic disease are seen.   Assessment / Plan / Recommendation Clinical Impression  Pt appears to present w/ Mild+ Dysarthria and motor speech deficits; Apraxic-like lingual/oral behavior. This slightly impacts his verbal communication at the conversation level. When pt utilized strategies of slowing rate of speech,  emphasizing speech sounds, and increasing effort/volume, pt's Dysarthria improved and intelligibility increased fully. Practiced w/ pt looking for places in a sentence ~every 2-3 words to pause/replenish breath support to complete the sentence/task w/ good articulation and control w/ speech sounds. Pt wanted to speak more softly w/ low volume but noted when he emphasized words w/ min increased volume, intelligibility greatly improved. Instructed pt to focus on his "control" during his speech to produce full articulatory movements vs mumbled, closed-mouth movements in his speech. Pt followed instrucation appropriately. Discussed the importance of sitting fully upright to support diahpragmatic breathing and breath support for speech.  NO expressive or receptive aphasia noted, No cognitive deficits noted. OM exam revealed Left labial decreased tone/ROM; and R lateral lingual weakness - ROM and symmetry were appropriate. Discussed Fluency Strategies w/ pt; practice and gave Handouts. Recommend pt f/u w/ Outpatient skilled ST servcies to continue working on Dysarthria through education and strategies to improve communication in ADLs. NSG/MD updated. Pt agreed but stated he did not feel he would need therapy when going home. ST services will be available for further if new needs arise while admitted.    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)    Follow Up Recommendations  Outpatient SLP (vs CIR per PT/OT determination)    Frequency and Duration  (n/a)   (n/a)      SLP Evaluation Cognition  Overall Cognitive Status: Within Functional Limits for tasks  assessed Arousal/Alertness: Awake/alert Orientation Level: Oriented X4 Attention: Focused;Sustained Focused Attention: Appears intact Sustained Attention: Appears intact Awareness:  Appears intact Problem Solving: Appears intact Safety/Judgment: Appears intact       Comprehension  Auditory Comprehension Overall Auditory Comprehension: Appears within functional limits for tasks assessed Yes/No Questions: Within Functional Limits Commands: Within Functional Limits Conversation: Complex Visual Recognition/Discrimination Discrimination: Not tested Reading Comprehension Reading Status: Within funtional limits    Expression Expression Primary Mode of Expression: Verbal Verbal Expression Overall Verbal Expression: Appears within functional limits for tasks assessed Initiation: No impairment Automatic Speech: Name;Social Response Level of Generative/Spontaneous Verbalization: Conversation Naming: No impairment Pragmatics: No impairment Non-Verbal Means of Communication: Not applicable Written Expression Written Expression: Not tested   Oral / Motor  Oral Motor/Sensory Function Overall Oral Motor/Sensory Function: Mild impairment (-Moderate impairment) Facial ROM: Reduced left;Suspected CN VII (facial) dysfunction Facial Symmetry: Abnormal symmetry left;Suspected CN VII (facial) dysfunction Facial Strength: Reduced left;Suspected CN VII (facial) dysfunction Lingual ROM: Within Functional Limits Lingual Symmetry: Within Functional Limits Lingual Strength: Reduced;Suspected CN XII (hypoglossal) dysfunction (on R lateral) Velum: Within Functional Limits Mandible: Within Functional Limits Motor Speech Overall Motor Speech: Impaired Respiration: Within functional limits Phonation: Normal Resonance: Within functional limits Articulation:  [facility]) Intelligibility: Intelligibility reduced (slightly) Word: 75-100% accurate Phrase: 75-100% accurate Sentence: 75-100% accurate Conversation: 75-100% accurate Motor Speech Errors: Aware Effective Techniques: Slow rate;Increased vocal intensity;Over-articulate   GO                      Jerilynn Som, MS,  CCC-SLP Speech Language Pathologist [facility] [phone] [facility] 03/30/2021, 1:23 PM

## 2021-03-30 NOTE — H&P (Signed)
History and Physical    BROADY LAFOY OFB:[id number] DOB: 1981-05-07 DOA: 03/29/2021  PCP: [facility]  Patient coming from: Home  I have personally briefly reviewed patient's old medical records in [place]  Chief Complaint: Left facial droop, right-sided blurry vision  HPI: Jeremy Hudson is a 40 y.o. male with medical history significant for hypertension, CKD stage III, anxiety who presents to the ED for evaluation of left facial droop and right blurry vision.  Patient noticed sometime on 5/26 that when he was trying to drink something the liquid was drilling out the left side of his mouth.  He then realized his left face was drooping.  On 5/27 he noticed that his vision was a little bit funny in his right eye.  He also noticed some numbness in his right lower leg and felt as if he was having some trouble walking due to feeling off balance.  He also noted some abnormal taste when eating food.  He did not have any weakness in his arms or legs.  He did not have any headache, nausea, vomiting, chest pain, dyspnea, abdominal pain, dysuria, diarrhea.  Reports recent marijuana use but denies any tobacco use.  He denies any recent alcohol use.  He denies any cocaine or IV drug use.  He reports that history of stroke in his maternal grandfather.  ED Course:  Initial vitals show BP 186/125, pulse 79, RR 30, temp 98.5 F, SPO2 100% on room air.  Labs show WBC 11.1, hemoglobin 14.6, platelets 386,000, sodium 137, potassium 3.2, bicarb 22, BUN 25, creatinine 2.30, serum glucose 95, AST 37, ALT 59, alk phos 98, total bilirubin 1.1, serum ethanol <10.  CT head without contrast was negative for acute intracranial infarct or other abnormality.  Multiple remote lacunar infarcts about the deep gray nuclei with probable chronic microvascular ischemic disease are seen.  Teleneurology were consulted and recommended MRI brain for further work-up of CVA versus Bell's palsy.  MRI  brain was obtained and showed a 1.2 cm acute ischemic nonhemorrhagic infarct involving the left paramedian pons and additional 6 mm acute ischemic nonhemorrhagic infarct at the posterior left lentiform nucleus.  Patient was given aspirin 324 mg, amlodipine 5 mg, hydralazine 100 mg, Ativan 1 mg.  The hospitalist service was consulted to admit for further evaluation and management.  Review of Systems: All systems reviewed and are negative except as documented in history of present illness above.   Past Medical History:  Diagnosis Date  . Hypertension     Past Surgical History:  Procedure Laterality Date  . NO PAST SURGERIES      Social History:  reports that he has quit smoking. He has never used smokeless tobacco. He reports current alcohol use. He reports current drug use. Drug: Marijuana.  No Known Allergies  Family History  Problem Relation Age of Onset  . Stroke Maternal Grandfather      Prior to Admission medications   Medication Sig Start Date End Date Taking? Authorizing Provider  predniSONE (DELTASONE) 50 MG tablet 1 tab by mouth daily 03/29/21  Yes Delman Kitten, MD  valACYclovir (VALTREX) 1000 MG tablet Take 1 tablet (1,000 mg total) by mouth 2 (two) times daily for 10 days. 03/29/21 04/08/21 Yes Delman Kitten, MD  amLODipine (NORVASC) 10 MG tablet Take 1 tablet (10 mg total) by mouth daily. 02/23/18   Hillary Bow, MD  Blood Pressure KIT 1 Units by Does not apply route daily. 02/23/18   Sudini, Srikar,  MD  hydrALAZINE (APRESOLINE) 100 MG tablet Take 1 tablet (100 mg total) by mouth every 8 (eight) hours. 02/23/18   Hillary Bow, MD  hydrochlorothiazide (HYDRODIURIL) 50 MG tablet Take 1 tablet (50 mg total) by mouth daily. 02/23/18   Hillary Bow, MD  labetalol (NORMODYNE) 100 MG tablet Take 1 tablet (100 mg total) by mouth 2 (two) times daily. 02/23/18   Hillary Bow, MD  oxyCODONE-acetaminophen (ROXICET) 5-325 MG tablet Take 1 tablet by mouth every 4 (four) hours as needed  for severe pain. Patient not taking: Reported on 02/21/2018 08/26/16   Paulette Blanch, MD    Physical Exam: Vitals:   03/29/21 2310 03/29/21 2312 03/30/21 0000 03/30/21 0101  BP: (!) 156/115  (!) 114/99 127/75  Pulse: 80 83 (!) 56 70  Resp: _0 Temp:    98 F (36.7 C)  TempSrc:    Oral  SpO2: 100% 100% 99% 100%  Weight:      Height:       Constitutional: NAD, calm, comfortable Eyes: PERRL, EOMI, lids and conjunctivae normal ENMT: Mucous membranes are moist. Posterior pharynx clear of any exudate or lesions.Normal dentition.  Neck: normal, supple, no masses. Respiratory: clear to auscultation bilaterally, no wheezing, no crackles. Normal respiratory effort. No accessory muscle use.  Cardiovascular: Regular rate and rhythm, no murmurs / rubs / gallops. No extremity edema. 2+ pedal pulses. Abdomen: no tenderness, no masses palpated. No hepatosplenomegaly. Bowel sounds positive.  Musculoskeletal: no clubbing / cyanosis. No joint deformity upper and lower extremities. Good ROM, no contractures. Normal muscle tone.  Skin: no rashes, lesions, ulcers. No induration Neurologic: Exhibits weakness to left lower face otherwise CN 2-12 grossly intact. Sensation intact. Strength 5/5 in all 4.  Psychiatric: Normal judgment and insight. Alert and oriented x 3. Normal mood.    Labs on Admission: I have personally reviewed following labs and imaging studies  CBC: Recent Labs  Lab 03/29/21 2156  WBC 11.1*  NEUTROABS 6.8  HGB 14.6  HCT 43.9  MCV 81.3  PLT 353   Basic Metabolic Panel: Recent Labs  Lab 03/29/21 2156  NA 137  K 3.2*  CL 103  CO2 22  GLUCOSE 95  BUN 25*  CREATININE 2.30*  CALCIUM 10.1   GFR: Estimated Creatinine Clearance: 46.3 mL/min (A) (by C-G formula based on SCr of 2.3 mg/dL (H)). Liver Function Tests: Recent Labs  Lab 03/29/21 2156  AST 37  ALT 59*  ALKPHOS 98  BILITOT 1.1  PROT 8.5*  ALBUMIN 4.6   No results for input(s): LIPASE, AMYLASE in  the last 168 hours. No results for input(s): AMMONIA in the last 168 hours. Coagulation Profile: Recent Labs  Lab 03/29/21 2156  INR 1.0   Cardiac Enzymes: No results for input(s): CKTOTAL, CKMB, CKMBINDEX, TROPONINI in the last 168 hours. BNP (last 3 results) No results for input(s): PROBNP in the last 8760 hours. HbA1C: No results for input(s): HGBA1C in the last 72 hours. CBG: Recent Labs  Lab 03/29/21 2134 03/29/21 2151  GLUCAP 143* 111*   Lipid Profile: No results for input(s): CHOL, HDL, LDLCALC, TRIG, CHOLHDL, LDLDIRECT in the last 72 hours. Thyroid Function Tests: No results for input(s): TSH, T4TOTAL, FREET4, T3FREE, THYROIDAB in the last 72 hours. Anemia Panel: No results for input(s): VITAMINB12, FOLATE, FERRITIN, TIBC, IRON, RETICCTPCT in the last 72 hours. Urine analysis:    Component Value Date/Time   COLORURINE YELLOW (A) 02/21/2018 1812   APPEARANCEUR CLEAR (A) 02/21/2018 1812  LABSPEC 1.018 02/21/2018 1812   PHURINE 6.0 02/21/2018 1812   GLUCOSEU NEGATIVE 02/21/2018 1812   HGBUR SMALL (A) 02/21/2018 1812   BILIRUBINUR NEGATIVE 02/21/2018 1812   KETONESUR 20 (A) 02/21/2018 1812   PROTEINUR 100 (A) 02/21/2018 1812   NITRITE NEGATIVE 02/21/2018 1812   LEUKOCYTESUR NEGATIVE 02/21/2018 1812    Radiological Exams on Admission: MR BRAIN WO CONTRAST  Result Date: 03/29/2021 CLINICAL DATA:  Initial evaluation for acute cranial 7 neuropathy. Left-sided facial droop with right sided blurry vision. EXAM: MRI HEAD WITHOUT CONTRAST TECHNIQUE: Multiplanar, multiecho pulse sequences of the brain and surrounding structures were obtained without intravenous contrast. COMPARISON:  Prior head CT from earlier the same day. FINDINGS: Brain: Mildly advanced cerebral atrophy for age with scattered prominence of the perivascular spaces. Patchy T2/FLAIR hyperintensity involving the periventricular white matter, most consistent with chronic small vessel ischemic disease, also  advanced for age. Few scattered remote lacunar infarcts present about the bilateral basal ganglia and right thalamus. Small chronic microhemorrhage at the right thalamus likely related to small vessel disease. 1.2 cm linear focus of restricted diffusion involving the left paramedian pons, consistent with an acute small vessel type infarct (series 5, image 13). This likely accounts for the patient's symptoms given location. No associated hemorrhage or mass effect. There is an additional 6 mm acute ischemic nonhemorrhagic infarct at the posterior left lentiform nucleus (series 5, image 24). No associated mass effect. No other evidence for acute or subacute ischemia. Gray-white matter differentiation otherwise maintained. No other areas of remote cortical infarction. No acute intracranial hemorrhage. No mass lesion, midline shift or mass effect. No hydrocephalus or extra-axial fluid collection. Pituitary gland suprasellar region normal. Midline structures intact. Vascular: Major intracranial vascular flow voids are maintained. Skull and upper cervical spine: Craniocervical junction within normal limits. Bone marrow signal intensity normal. No scalp soft tissue abnormality. Sinuses/Orbits: Globes and orbital soft tissues within normal limits. Paranasal sinuses are clear. No mastoid effusion. Inner ear structures within normal limits. Other: None. IMPRESSION: 1. 1.2 cm acute ischemic nonhemorrhagic infarct involving the left paramedian pons. This likely accounts for the patient's symptoms given location. 2. Additional 6 mm acute ischemic nonhemorrhagic infarct at the posterior left lentiform nucleus. 3. Underlying age-related cerebral atrophy with chronic microvascular ischemic disease, with a few additional remote lacunar infarcts about the deep gray nuclei. Overall, appearance is advanced for age. Electronically Signed   By: Jeannine Boga M.D.   On: 03/29/2021 23:49   CT HEAD CODE STROKE WO CONTRAST  Result  Date: 03/29/2021 CLINICAL DATA:  Code stroke. Initial evaluation for acute blurry vision. EXAM: CT HEAD WITHOUT CONTRAST TECHNIQUE: Contiguous axial images were obtained from the base of the skull through the vertex without intravenous contrast. COMPARISON:  None. FINDINGS: Brain: Mildly advanced cerebral volume loss for age. Few scatter remote lacunar infarcts noted about the bilateral basal ganglia and right thalamus. Patchy hypodensity seen involving the supratentorial cerebral white matter, nonspecific, but most likely related chronic microvascular ischemic disease given the presence of the lacunar infarcts. No acute intracranial hemorrhage. No acute large vessel territory infarct. No mass lesion or midline shift. No hydrocephalus or extra-axial fluid collection. Vascular: No hyperdense vessel. Skull: Scalp soft tissues and calvarium within normal limits. Sinuses/Orbits: Conjugate right gaze noted. Globes and orbital soft tissues demonstrate no other acute finding. Paranasal sinuses are clear. No mastoid effusion. Other: None. ASPECTS [facility] Stroke Program Early CT Score) - Ganglionic level infarction (caudate, lentiform nuclei, internal capsule, insula, M1-M3 cortex): 7 - Supraganglionic  infarction (M4-M6 cortex): 3 Total score (0-10 with 10 being normal): 10 IMPRESSION: 1. No acute intracranial infarct or other abnormality. 2. ASPECTS is 10. 3. Multiple remote lacunar infarcts about the deep gray nuclei, with probable chronic microvascular ischemic disease involving the supratentorial cerebral white matter, advanced for age. Critical Value/emergent results were called by telephone at the time of interpretation on 03/29/2021 at 10:02 pm to provider MARK QUALE , who verbally acknowledged these results. Electronically Signed   By: Jeannine Boga M.D.   On: 03/29/2021 22:06    EKG: Personally reviewed. Sinus rhythm, T wave inversion inferior lateral leads.  T wave changes new compared to  prior.  Assessment/Plan Principal Problem:   Acute CVA (cerebrovascular accident) [facility]) Active Problems:   Hypertension   CKD (chronic kidney disease), stage III ([place])   DMITRY MACOMBER is a 40 y.o. male with medical history significant for hypertension, CKD stage III, anxiety who is admitted with acute stroke.  Acute CVA: MRI brain showing acute ischemic nonhemorrhagic infarcts involving the left paramedian pons and posterior left lentiform nucleus.  He is admitted for further stroke work-up. -Seen by teleneurology -Start aspirin 81 mg daily and Plavix 75 mg daily -MRA head and neck -Echocardiogram -Check A1c, lipid panel -PT/OT/SLP eval -Monitor on telemetry, continue neurochecks -Allow permissive hypertension for now, use IV labetalol if needed for SBP >220 and/or DBP >110  CKD stage III: No recent baseline labs for comparison, slightly worse than prior.  Will place on IV fluid hydration overnight and repeat labs in AM.  Hypertension: Home meds on hold for now while allowing permissive hypertension.  Anxiety: Will use Atarax as needed.  DVT prophylaxis: Lovenox Code Status: Full code, confirmed with patient Family Communication: Discussed with patient, he has discussed with family Disposition Plan: From home, dispo pending further CVA work-up Consults called: Teleneurology Level of care: Med-Surg Admission status:  Status is: Observation  The patient remains OBS appropriate and will d/c before 2 midnights.  Dispo: The patient is from: Home              Anticipated d/c is to: Home              Patient currently is not medically stable to d/c.   Difficult to place patient No   Zada Finders MD [facility]  If 7PM-7AM, please contact night-coverage [email or web address]  03/30/2021, 1:17 AM

## 2021-03-30 NOTE — Progress Notes (Signed)
Pt s BP is 195/125, MD made aware and PRN given as ordered

## 2021-03-30 NOTE — Progress Notes (Signed)
OT Cancellation Note  Patient Details Name: Jeremy Hudson MRN: [id number] DOB: 1981/02/13   Cancelled Treatment:    Reason Eval/Treat Not Completed: Medical issues which prohibited therapy.OT order received and chart reviewed.  Pt's K+ noted to be decreased to 2.6 this morning.  Per therapy guidelines (for low potassium), exertional activity contra-indicated.  Will hold OT at this time and re-attempt OT evaluation at a later date/time as medically appropriate.  Jackquline Denmark, MS, OTR/L , CBIS ascom [phone]  03/30/21, 8:37 AM   03/30/2021, 8:36 AM

## 2021-03-30 NOTE — Progress Notes (Signed)
PT Cancellation Note  Patient Details Name: Jeremy Hudson MRN: [id number] DOB: 26-Mar-1981   Cancelled Treatment:    Reason Eval/Treat Not Completed: Patient not medically ready.  PT consult received.  Chart reviewed.  Pt's K+ noted to be decreased to 2.6 this morning.  Per PT guidelines (for low potassium), exertional activity contra-indicated.  Will hold PT at this time and re-attempt PT evaluation at a later date/time as medically appropriate.  Hendricks Limes, PT 03/30/21, 7:54 AM

## 2021-03-30 NOTE — Progress Notes (Signed)
*  PRELIMINARY RESULTS* Echocardiogram 2D Echocardiogram has been performed.  Jeremy Hudson 03/30/2021, 8:54 AM

## 2021-03-30 NOTE — ED Notes (Signed)
Dr. Allena Katz at bedside. Patient updated on POC, patient aware of inpatient bed assignment and pending transport.

## 2021-03-30 NOTE — ED Notes (Signed)
Transport requested for patient.  

## 2021-03-30 NOTE — Progress Notes (Addendum)
Subjective: Patient admitted this morning, see detailed H&P by Dr Allena Katz 40 year old male with medical history of hypertension, CKD stage III presented to ED with complaints of left facial droop, right blurry vision.  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  Patient started on aspirin and Plavix.  Echocardiogram ordered.  MRA head and neck ordered.  Vitals:   03/30/21 1218 03/30/21 1353  BP: (!) 195/125 (!) 169/116  Pulse: 95 96  Resp: (!) 24 (!) 22  Temp: 98.8 F (37.1 C)   SpO2: 100% 100%      A/P Acute CVA-follow echocardiogram, MRA head and neck.  Continue aspirin and Plavix.  Will consult neurology for further recommendations.  Allow permissive hypertension, IV labetalol ordered for SBP greater than 220/110.  CKD stage III-creatinine better this morning at 2.15.  Hypokalemia-potassium was 2.6 this morning.  Will replace potassium and follow BMP in am.  We will also check serum magnesium.    Meredeth Ide [facility] [phone]

## 2021-03-31 ENCOUNTER — Encounter: Payer: Self-pay | Admitting: Family Medicine

## 2021-03-31 ENCOUNTER — Observation Stay: Payer: Self-pay

## 2021-03-31 DIAGNOSIS — R509 Fever, unspecified: Secondary | ICD-10-CM

## 2021-03-31 DIAGNOSIS — I639 Cerebral infarction, unspecified: Secondary | ICD-10-CM

## 2021-03-31 DIAGNOSIS — N1832 Chronic kidney disease, stage 3b: Secondary | ICD-10-CM

## 2021-03-31 DIAGNOSIS — I10 Essential (primary) hypertension: Secondary | ICD-10-CM

## 2021-03-31 LAB — BASIC METABOLIC PANEL
Anion gap: 12 (ref 5–15)
BUN: 21 mg/dL — ABNORMAL HIGH (ref 6–20)
CO2: 19 mmol/L — ABNORMAL LOW (ref 22–32)
Calcium: 9.8 mg/dL (ref 8.9–10.3)
Chloride: 106 mmol/L (ref 98–111)
Creatinine, Ser: 1.93 mg/dL — ABNORMAL HIGH (ref 0.61–1.24)
GFR, Estimated: 45 mL/min — ABNORMAL LOW (ref >60)
Glucose, Bld: 112 mg/dL — ABNORMAL HIGH (ref 70–99)
Potassium: 3.5 mmol/L (ref 3.5–5.1)
Sodium: 137 mmol/L (ref 135–145)

## 2021-03-31 LAB — CBC WITH DIFFERENTIAL/PLATELET
Abs Immature Granulocytes: 0.12 10*3/uL — ABNORMAL HIGH (ref 0.00–0.07)
Basophils Absolute: 0.1 10*3/uL (ref 0.0–0.1)
Basophils Relative: 0 %
Eosinophils Absolute: 0 10*3/uL (ref 0.0–0.5)
Eosinophils Relative: 0 %
HCT: 40.5 % (ref 39.0–52.0)
Hemoglobin: 14.2 g/dL (ref 13.0–17.0)
Immature Granulocytes: 1 %
Lymphocytes Relative: 5 %
Lymphs Abs: 1.1 10*3/uL (ref 0.7–4.0)
MCH: 27.8 pg (ref 26.0–34.0)
MCHC: 35.1 g/dL (ref 30.0–36.0)
MCV: 79.3 fL — ABNORMAL LOW (ref 80.0–100.0)
Monocytes Absolute: 1.3 10*3/uL — ABNORMAL HIGH (ref 0.1–1.0)
Monocytes Relative: 6 %
Neutro Abs: 19.2 10*3/uL — ABNORMAL HIGH (ref 1.7–7.7)
Neutrophils Relative %: 88 %
Platelets: 393 10*3/uL (ref 150–400)
RBC: 5.11 MIL/uL (ref 4.22–5.81)
RDW: 14.7 % (ref 11.5–15.5)
WBC: 21.8 10*3/uL — ABNORMAL HIGH (ref 4.0–10.5)
nRBC: 0 % (ref 0.0–0.2)

## 2021-03-31 MED ORDER — HYDRALAZINE HCL 50 MG PO TABS
100.0000 mg | ORAL_TABLET | Freq: Three times a day (TID) | ORAL | Status: DC | PRN
  Administered 2021-03-31 – 2021-04-01 (×2): 100 mg via ORAL
  Filled 2021-03-31 (×5): qty 2

## 2021-03-31 MED ORDER — ATORVASTATIN CALCIUM 20 MG PO TABS
40.0000 mg | ORAL_TABLET | Freq: Every day | ORAL | Status: DC
  Administered 2021-03-31: 40 mg via ORAL
  Filled 2021-03-31: qty 2

## 2021-03-31 MED ORDER — POLYETHYLENE GLYCOL 3350 17 G PO PACK
17.0000 g | PACK | Freq: Every day | ORAL | Status: DC
  Administered 2021-03-31 – 2021-04-01 (×2): 17 g via ORAL
  Filled 2021-03-31 (×2): qty 1

## 2021-03-31 MED ORDER — LABETALOL HCL 100 MG PO TABS
100.0000 mg | ORAL_TABLET | Freq: Two times a day (BID) | ORAL | Status: DC
  Administered 2021-03-31 – 2021-04-01 (×2): 100 mg via ORAL
  Filled 2021-03-31 (×3): qty 1

## 2021-03-31 MED ORDER — AMLODIPINE BESYLATE 10 MG PO TABS
10.0000 mg | ORAL_TABLET | Freq: Every day | ORAL | Status: DC
  Administered 2021-03-31 – 2021-04-01 (×2): 10 mg via ORAL
  Filled 2021-03-31 (×2): qty 1

## 2021-03-31 NOTE — Progress Notes (Signed)
   03/31/21 1556  Assess: MEWS Score  Temp 99.3 F (37.4 C)  BP (!) 187/125  Pulse Rate (!) 105  ECG Heart Rate (!) 105  Resp (!) 25  Level of Consciousness Alert  SpO2 98 %  O2 Device Room Air  Patient Activity (if Appropriate) In bed  Assess: MEWS Score  MEWS Temp 0  MEWS Systolic 0  MEWS Pulse 1  MEWS RR 1  MEWS LOC 0  MEWS Score 2  MEWS Score Color Yellow  Assess: if the MEWS score is Yellow or Red  Were vital signs taken at a resting state? Yes  Focused Assessment No change from prior assessment  Early Detection of Sepsis Score *See Row Information* Medium  MEWS guidelines implemented *See Row Information* No, previously yellow, continue vital signs every 4 hours  Treat  MEWS Interventions Administered scheduled meds/treatments  Pain Scale 0-10  Pain Score 0  Notify: Provider  Provider Name/Title [place]  Date Provider Notified 03/31/21  Time Provider Notified 1600  Notification Type Page  Notification Reason Other (Comment) (results for recheck VS for PRN labetalol)  Provider response No new orders  Date of Provider Response 03/31/21  Time of Provider Response 1601  Document  Patient Outcome Other (Comment) (will continue to monitor/ pt will get more scheduled BP meds at 1800)

## 2021-03-31 NOTE — Evaluation (Signed)
Physical Therapy Evaluation Patient Details Name: Jeremy Hudson MRN: [id number] DOB: [birth year] Today's Date: 03/31/2021   History of Present Illness  Pt is a 40 y.o. male presenting to hospital 5/27 with L facial droop and R eye blurry vision; numbness R lower leg and some difficulty walking d/t feeling off balance.  Pt admitted with acute CVA (MRI brain showed acute ischemic nonhemorrhagic infarct involving the left paramedian pons and posterior left lentiform nucleus).  PMH includes htn, AKI.  Clinical Impression  Prior to hospital admission, pt was independent with functional mobility; lives with family in 1 level home (no steps to enter).  Currently pt is modified independent with logrolling in bed; deferred OOB mobility d/t BP elevated to 191/127 (changed BP cuff size for improved fit and BP then 172/111--pt recently given BP medication d/t elevated BP--nurse present for last BP reading).  Intact B LE light touch, proprioception, tone, heel to shin coordination; able to perform B LE SLR independently.  Pt would benefit from skilled PT to address any impairments and functional limitations (see below for any additional details).  Will continue to monitor pt's status/progress and will update therapy recommendations once functional mobility and balance able to be assessed.    Follow Up Recommendations Other (comment) (TBD pending further mobility assessment)    Equipment Recommendations  Other (comment) (TBD pending further mobility assessment)    Recommendations for Other Services       Precautions / Restrictions Precautions Precautions: Fall Restrictions Weight Bearing Restrictions: No      Mobility  Bed Mobility Overal bed mobility: Modified Independent             General bed mobility comments: no difficulties logrolling in bed    Transfers         General transfer comment: Deferred d/t elevated BP at rest  Ambulation/Gait             General Gait Details:  Deferred d/t elevated BP at rest  Stairs            Wheelchair Mobility    Modified Rankin (Stroke Patients Only)       Balance                                  Pertinent Vitals/Pain Pain Assessment: No/denies pain    Home Living Family/patient expects to be discharged to:: Private residence Living Arrangements: Parent;Other relatives;Children (pt's dad, pt's daughter, pt's 3 nephews and 1 niece) Available Help at Discharge: Family;Available PRN/intermittently Type of Home: House Home Access: Level entry     Home Layout: One level Home Equipment: None      Prior Function Level of Independence: Independent         Comments: No falls in past 6 months.  Cares for his sister's children (sister passed away in 03-03-20)     Hand Dominance   Dominant Hand: Left    Extremity/Trunk Assessment   Upper Extremity Assessment Upper Extremity Assessment: Defer to OT evaluation    Lower Extremity Assessment Lower Extremity Assessment:  (intact B LE light touch, heel to shin coordination, tone, and proprioception; able to perform SLR independently B LE's)    Cervical / Trunk Assessment Cervical / Trunk Assessment: Normal  Communication   Communication: No difficulties  Cognition Arousal/Alertness: Awake/alert Behavior During Therapy: Anxious Overall Cognitive Status: Within Functional Limits for tasks assessed  General Comments: Pt appearing anxious during session (pt reports his sister passed away at this hospital in December 2021)      General Comments   Nursing cleared pt for participation in physical therapy.  Pt agreeable to PT session.    Exercises    Assessment/Plan    PT Assessment Patient needs continued PT services  PT Problem List Decreased strength;Decreased activity tolerance;Decreased mobility;Cardiopulmonary status limiting activity;Decreased knowledge of use of DME;Decreased  balance;Decreased knowledge of precautions       PT Treatment Interventions DME instruction;Gait training;Functional mobility training;Therapeutic activities;Therapeutic exercise;Balance training;Patient/family education    PT Goals (Current goals can be found in the Care Plan section)  Acute Rehab PT Goals Patient Stated Goal: to go home PT Goal Formulation: With patient Time For Goal Achievement: 04/14/21 Potential to Achieve Goals: Good    Frequency 7X/week   Barriers to discharge        Co-evaluation               AM-PAC PT "6 Clicks" Mobility  Outcome Measure Help needed turning from your back to your side while in a flat bed without using bedrails?: None Help needed moving from lying on your back to sitting on the side of a flat bed without using bedrails?: A Little Help needed moving to and from a bed to a chair (including a wheelchair)?: A Little Help needed standing up from a chair using your arms (e.g., wheelchair or bedside chair)?: A Little Help needed to walk in hospital room?: A Little Help needed climbing 3-5 steps with a railing? : A Little 6 Click Score: 19    End of Session   Activity Tolerance: Other (comment) (Limited evaluation d/t elevated diastolic BP) Patient left: in bed;with call bell/phone within reach;with bed alarm set Nurse Communication: Mobility status;Precautions;Other (comment) (pt's elevated BP) PT Visit Diagnosis: Unsteadiness on feet (R26.81);Other abnormalities of gait and mobility (R26.89)    Time: 6759-1638 PT Time Calculation (min) (ACUTE ONLY): 12 min   Charges:   PT Evaluation $PT Eval Low Complexity: 1 Low         Alexzandrea Normington, PT 03/31/21, 2:16 PM

## 2021-03-31 NOTE — Progress Notes (Signed)
   03/31/21 1252  Vitals  Temp 99.6 F (37.6 C)  Temp Source Oral  BP (!) 176/123  MAP (mmHg) 138  BP Location Right Arm  BP Method Automatic  Patient Position (if appropriate) Sitting  Pulse Rate (!) 108  Resp (!) 25  Level of Consciousness  Level of Consciousness Alert  MEWS COLOR  MEWS Score Color Yellow  Oxygen Therapy  SpO2 100 %  O2 Device Room Air   Paged MD lama

## 2021-03-31 NOTE — Evaluation (Signed)
Occupational Therapy Evaluation Patient Details Name: Jeremy Hudson MRN: [id number] DOB: 1981-05-06 Today's Date: 03/31/2021    History of Present Illness Pt is a 40 y/o M with PMH: HTN and CKD stage III who presented to ED with c/o L facial droop, R blurry vision.  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   Clinical Impression   Pt seen for OT evaluation this date in setting of presenting to ED d/t L facial droop and blurry vision. Pt reports that his vision appears to be normalizing today with only intermittent difficulties that were not present when OT assessed. Pt reports being INDEP at baseline and living with his father and niece and 3 nephews. States he also has assistance from his 12 y/o daughter and has a 43 y/o son. Pt presents this date somewhat anxious-appearing and is somewhat perseverative on feeling hot and cold. Temp taken and 99.4 which is reported to RN. Pt endorses also feeling anxious d/t the fact that his sister passed away in this hospital in December. Pt present this date with UE strength, ROM and FMC seemingly equal and functional bilaterally on assessment. Pt is able to perform sup to sit to EOB with MOD I and demos G static sitting balance. Pt with some decreased dynamic standing balance and with difficulty performing single-leg stance on R LE. Pt requires CGA for more dynamic tasks or to balance standing on one LE, at least UE support or OT steadying him. Pt with no lingering appearance of facial droop on OT assessment at this time and reports his face feels more normal. Overall, pt with some symptoms resolving, but could benefit from OT f/u d/t decreased dynamic balance, impacting his safety with IADLs as he is responsible for caring for his family. Will continue to follow.     Follow Up Recommendations  No OT follow up    Equipment Recommendations  Tub/shower seat    Recommendations  for Other Services       Precautions / Restrictions Precautions Precautions: Fall Restrictions Weight Bearing Restrictions: No      Mobility Bed Mobility Overal bed mobility: Modified Independent                  Transfers Overall transfer level: Needs assistance   Transfers: Sit to/from Stand;Stand Pivot Transfers Sit to Stand: Supervision Stand pivot transfers: Supervision       General transfer comment: close supervision. While pt demos no need for assistance or AD for ADL transfers, he is noted to have some mildly decreased standing balance (esp dynamic)    Balance Overall balance assessment: Mild deficits observed, not formally tested Sitting-balance support: Feet supported Sitting balance-Leahy Scale: Good     Standing balance support: No upper extremity supported Standing balance-Leahy Scale: Fair Standing balance comment: some LOB with more dynamic tasks, pt is mostly able to self correct. Cannot R LE single leg stand for 5 sec this date                           ADL either performed or assessed with clinical judgement   ADL                                         General ADL Comments: close SBA/CGA for some more dynamic standing ADLs as  pt with decreased R side balance with low reaching     Vision Patient Visual Report: Blurring of vision (blurred only in R eye, pt feels it is resolving today (better then when he presnted to ED)) Vision Assessment?: Yes Eye Alignment: Within Functional Limits Ocular Range of Motion: Within Functional Limits Alignment/Gaze Preference: Within Defined Limits Additional Comments: peripheral vision in tact and pt reports feeling that his vision is mostly returning to normal this date.     Perception     Praxis      Pertinent Vitals/Pain Pain Assessment: No/denies pain     Hand Dominance Left   Extremity/Trunk Assessment Upper Extremity Assessment Upper Extremity Assessment:  Overall WFL for tasks assessed (sensation, strength and ROM assessed bilaterally and appear to be equal and functional. May need to re-visit as pt somewhat more focused on his increasd temperature and somewhat easily distractable from extremity assessment.)   Lower Extremity Assessment Lower Extremity Assessment:  (pt with difficulty with R LE SLS, but overall appears to be equal with LE ROM as it applies to ADLs.)       Communication Communication Communication: No difficulties   Cognition Arousal/Alertness: Awake/alert Behavior During Therapy: WFL for tasks assessed/performed;Anxious Overall Cognitive Status: Within Functional Limits for tasks assessed                                 General Comments: oriented, able to follow commands, but very fidgety and hyperactive. Endorses feeling anxious which is reported to the RN. States in-part, this is d/t his sister passing away at this hospital in December 2021.   General Comments       Exercises Other Exercises Other Exercises: OT educates re: role of OT, importance of OOB activity, safety considerations. pt with moderate reception, but is perseverative on treating his fever (low grade, 99.4, OT reported this to RN) at this time.   Shoulder Instructions      Home Living Family/patient expects to be discharged to:: Private residence Living Arrangements: Parent;Other relatives (dad, 65 y/o dtr, 16 y/o son, 3 nephews and 1 neice) Available Help at Discharge: Family;Available PRN/intermittently Type of Home: House Home Access: Level entry     Home Layout: One level     Bathroom Shower/Tub: Chief Strategy Officer: Standard     Home Equipment: None          Prior Functioning/Environment Level of Independence: Independent        Comments: Pt reports being INDEP at baseline including caring for his sister's children as she passed away in 02/19/20.        OT Problem List: Decreased activity  tolerance;Decreased coordination;Cardiopulmonary status limiting activity (high BP, 168/118 when OT assessed, reported to RN)      OT Treatment/Interventions: Self-care/ADL training;Therapeutic activities;Therapeutic exercise;Neuromuscular education;Balance training    OT Goals(Current goals can be found in the care plan section) Acute Rehab OT Goals Patient Stated Goal: to go home OT Goal Formulation: With patient Time For Goal Achievement: 04/14/21 Potential to Achieve Goals: Good ADL Goals Pt Will Transfer to Toilet: Independently;ambulating Pt Will Perform Tub/Shower Transfer: Independently Pt/caregiver will Perform Home Exercise Program: Increased strength;Both right and left upper extremity;With Supervision Additional ADL Goal #1: Pt will complete dynamic standing task to simulate HH IADL w/o AD with no LOB or ability to self-correct w/o assist.  OT Frequency: Min 1X/week   Barriers to D/C:  Co-evaluation              AM-PAC OT "6 Clicks" Daily Activity     Outcome Measure Help from another person eating meals?: None Help from another person taking care of personal grooming?: None Help from another person toileting, which includes using toliet, bedpan, or urinal?: None Help from another person bathing (including washing, rinsing, drying)?: A Little Help from another person to put on and taking off regular upper body clothing?: None Help from another person to put on and taking off regular lower body clothing?: A Little 6 Click Score: 22   End of Session Nurse Communication: Mobility status  Activity Tolerance: Patient tolerated treatment well Patient left: in bed;with call bell/phone within reach  OT Visit Diagnosis: Unsteadiness on feet (R26.81);Other symptoms and signs involving the nervous system (R29.898)                Time: 1771-1657 OT Time Calculation (min): 18 min Charges:  OT General Charges $OT Visit: 1 Visit OT Evaluation $OT Eval Low  Complexity: 1 Low OT Treatments $Therapeutic Activity: 8-22 mins  Rejeana Brock, MS, OTR/L ascom [phone] 03/31/21, 1:06 PM

## 2021-03-31 NOTE — Progress Notes (Signed)
VS after intervention   03/31/21 0541  Assess: MEWS Score  Temp (!) 100.7 F (38.2 C)  BP (!) 184/125  Pulse Rate (!) 114  Resp 20  SpO2 100 %  O2 Device Room Air  Assess: MEWS Score  MEWS Temp 1  MEWS Systolic 0  MEWS Pulse 2  MEWS RR 0  MEWS LOC 0  MEWS Score 3  MEWS Score Color Yellow  Document  Patient Outcome Stabilized after interventions

## 2021-03-31 NOTE — Consult Note (Signed)
Cardiology Consult    Patient ID: Jeremy Hudson MRN: [id number], DOB/AGE: 1981/08/12   Admit date: 03/29/2021 Date of Consult: 03/31/2021  Primary Physician: [facility] Primary Cardiologist: Julien Nordmann, MD Requesting Provider: Drusilla Kanner, MD  Patient Profile    SABINO DENNING is a 40 y.o. male with a history of HTN, CKD III, marijuana abuse, and anxiety, who is being seen today for the evaluation of abnl ECG in the setting of LVH and admission for stroke at the request of Dr. Sharl Ma.  Past Medical History   Past Medical History:  Diagnosis Date  . Anxiety   . CKD (chronic kidney disease), stage III (HCC)   . Hypertension   . LVH (left ventricular hypertrophy)    a. 03/2021 Echo: EF 60-65%, no rwma, mod LVH. Gr1 DD. Nl RV size/fxn. Mild MR.  . Marijuana abuse   . Stroke [facility])    a. 03/2021 MRI Brain: 1.2cm acute isch nonhemorrhagic infarct of L paramedian pons. Additional 44mm acute isch infarct @ post L lentiform nucleus. Additional remote lacunar infarcts about the deep gray nuclei. MRA neg for large vessel occlusion.    Past Surgical History:  Procedure Laterality Date  . NO PAST SURGERIES       Allergies  No Known Allergies  History of Present Illness    40 year old male with the above past medical history including hypertension, stage III chronic kidney disease, marijuana abuse, and anxiety.  He has no known prior cardiac history. He lives locally with his children and his sister's children.  His sister died of COVID within the past 2 years.  He works in [employer] and denies any prior h/o chest pain or dyspnea, but he has noticed increasing fatigue at the end of his work day over the past few months.  A few wks ago, when he began to notice intermittent visual disturbances. He saw his pcp and was rx an anxiolytic. He says that he took it and he developed n/v, and so he stopped taking it.  He was rx an alternate medication, but never started it.  He is  followed @ [facility] and says that he has been compliant w/ outpt antihypertensives including hydralazine, amlodipine, and labetalol.  He doesn't check his BP often but says that he has seen numbers in the 1-teens.  When he's trending high @ home, he drinks vinegar, and he says it usually comes down quickly.  He was in his USOH on May 26, when he was trying to drink something and noted liquid spilling out of the left side of his mouth.  He also noted facial drooping.  On May 27, he noted a visual disturbance in his right eye as well as numbness in the right lower leg with unsteady gait.  He presented to the emergency department on May 27 where he was noted to be markedly hypertensive at 186/125.  CT of the head was negative for acute intracranial findings though multiple remote lacunar infarcts were noted.  MRI of the brain showed a 1.2 cm acute ischemic nonhemorrhagic infarct involving the left paramedian pons and additional 6 mm acute ischemic nonhemorrhagic infarct of the posterior left lentiform nucleus.  Admission ECG showed sinus rhythm at 93 with LVH and inferior ST depression/T wave inversion.  Patient was admitted and seen by neurology.  MRA of the head and neck did not show any significant large vessel occlusion.  He has remained hypertensive (permissively) with pressures trending in the 160s to 200  this morning.  He has been receiving amlodipine, labetalol, and prn hydralazine.  Aspirin, Plavix, and statin therapy have also been added.  Echocardiogram performed May 28 shows normal LVEF of 60-65% without regional wall motion abnormalities, grade 1 diastolic dysfunction, moderate LVH, mild MR, and normal RV function.  He has had a low-grade fever.  UA negative.  Blood cultures pending.  Follow-up ECG earlier today continues to show LVH with inferolateral ST depression T wave inversion.  These changes overall, or new since 2019, prompting cardiology consultation.   His only complaint this AM is  intermittent fevers/chills.  BC pending.  Inpatient Medications    . amLODipine  10 mg Oral Daily  . aspirin EC  81 mg Oral Daily  . atorvastatin  40 mg Oral Daily  . clopidogrel  75 mg Oral Daily  . heparin  5,000 Units Subcutaneous Q8H  . labetalol  100 mg Oral BID    Family History    Family History  Problem Relation Age of Onset  . Stroke Maternal Grandfather   . Other Sister        COVID   He indicated that his mother is alive. He indicated that his father is alive. He indicated that his sister is alive. He indicated that the status of his maternal grandfather is unknown.   Social History    Social History   Socioeconomic History  . Marital status: Single    Spouse name: Not on file  . Number of children: Not on file  . Years of education: Not on file  . Highest education level: Not on file  Occupational History  . Not on file  Tobacco Use  . Smoking status: Former Games developer  . Smokeless tobacco: Never Used  Vaping Use  . Vaping Use: Never used  Substance and Sexual Activity  . Alcohol use: Yes  . Drug use: Yes    Types: Marijuana    Comment: Last use today  . Sexual activity: Not on file  Other Topics Concern  . Not on file  Social History Narrative  . Not on file   Social Determinants of Health   Financial Resource Strain: Not on file  Food Insecurity: Not on file  Transportation Needs: Not on file  Physical Activity: Not on file  Stress: Not on file  Social Connections: Not on file  Intimate Partner Violence: Not on file     Review of Systems    General:  +++ malaise w/ intermittent fever/chills.  No, night sweats or weight changes.  Cardiovascular:  No chest pain, dyspnea on exertion, edema, orthopnea, palpitations, paroxysmal nocturnal dyspnea. Dermatological: No rash, lesions/masses Respiratory: No cough, dyspnea Urologic: No hematuria, dysuria Abdominal:   No nausea, vomiting, diarrhea, bright red blood per rectum, melena, or  hematemesis Neurologic:  +++ visual changes, left sided wkns/facial droop/unsteady gait, no changes in mental status. All other systems reviewed and are otherwise negative except as noted above.  Physical Exam    Blood pressure (!) 176/123, pulse (!) 108, temperature 99.6 F (37.6 C), temperature source Oral, resp. rate (!) 25, height 5\' 3"  (1.6 m), weight 101.9 kg, SpO2 100 %.  General: Pleasant, NAD Psych: Normal affect. Neuro: Alert and oriented X 3. Moves all extremities spontaneously. HEENT: L facial drrop.  Neck: Supple without bruits or JVD. Lungs:  Resp regular and unlabored, CTA. Heart: RRR no s3, s4, or murmurs. Abdomen: Soft, non-tender, non-distended, BS + x 4.  Extremities: No clubbing, cyanosis or edema. DP/PT2+, Radials 2+  and equal bilaterally.  Labs       Lab Results  Component Value Date   WBC 21.8 (H) 03/31/2021   HGB 14.2 03/31/2021   HCT 40.5 03/31/2021   MCV 79.3 (L) 03/31/2021   PLT 393 03/31/2021    Recent Labs  Lab 03/29/21 2156 03/30/21 0524 03/31/21 0449  NA 137   < > 137  K 3.2*   < > 3.5  CL 103   < > 106  CO2 22   < > 19*  BUN 25*   < > 21*  CREATININE 2.30*   < > 1.93*  CALCIUM 10.1   < > 9.8  PROT 8.5*  --   --   BILITOT 1.1  --   --   ALKPHOS 98  --   --   ALT 59*  --   --   AST 37  --   --   GLUCOSE 95   < > 112*   < > = values in this interval not displayed.   Lab Results  Component Value Date   CHOL 189 03/30/2021   HDL 28 (L) 03/30/2021   LDLCALC 101 (H) 03/30/2021   TRIG 302 (H) 03/30/2021    Radiology Studies    DG Chest 1 View  Result Date: 03/31/2021 CLINICAL DATA:  Fever EXAM: CHEST  1 VIEW COMPARISON:  01/05/2015 FINDINGS: Normal heart size and mediastinal contours. Low volume chest. No acute infiltrate or edema. No effusion or pneumothorax. No acute osseous findings. IMPRESSION: Negative for pneumonia Electronically Signed   By: Marnee SpringJonathon  Watts M.D.   On: 03/31/2021 07:13   MR ANGIO HEAD WO CONTRAST  Result  Date: 03/30/2021 CLINICAL DATA:  Stroke follow-up. EXAM: MRA NECK WITHOUT AND WITH CONTRAST MRA HEAD WITHOUT CONTRAST TECHNIQUE: Multiplanar and multiecho pulse sequences of the neck were obtained without and with intravenous contrast. Angiographic images of the neck were obtained using MRA technique without and with intravenous contrast; Angiographic images of the Circle of Willis were obtained using MRA technique without intravenous contrast. CONTRAST:  10mL GADAVIST GADOBUTROL 1 MMOL/ML IV SOLN COMPARISON:  MRI head Mar 29, 2021. FINDINGS: MRA NECK FINDINGS Aorta: Great vessel origins are he tends. Carotids: No evidence of significant (greater than 50%) stenosis. Vertebral arteries: Codominant. No evidence of significant (greater than 50%) stenosis. MRA HEAD FINDINGS Anterior circulation: No large vessel occlusion or hemodynamically significant proximal stenosis. Small (1-2 mm) outpouching arising from the left paraclinoid ICA (series 1, image 22 and series 1060 image 171). Posterior circulation: Bilateral intradural vertebral arteries, basilar artery, and the posterior cerebral arteries are patent without evidence of proximal hemodynamically significant stenosis. Mild narrowing of the distal basilar artery and right P2 PCA. IMPRESSION: 1. No large vessel occlusion or hemodynamically significant stenosis in the head or neck. 2. Small (1-2 mm) outpouching arising from the left paraclinoid ICA, which may represent a small aneurysm versus infundibulum with vessel too small to characterize by MRA. A follow-up CTA could further characterize. Electronically Signed   By: Feliberto HartsFrederick S Jones MD   On: 03/30/2021 17:57   MR ANGIO NECK W WO CONTRAST  Result Date: 03/30/2021 CLINICAL DATA:  Stroke follow-up. EXAM: MRA NECK WITHOUT AND WITH CONTRAST MRA HEAD WITHOUT CONTRAST TECHNIQUE: Multiplanar and multiecho pulse sequences of the neck were obtained without and with intravenous contrast. Angiographic images of the neck  were obtained using MRA technique without and with intravenous contrast; Angiographic images of the Circle of Willis were obtained using MRA technique without intravenous  contrast. CONTRAST:  64mL GADAVIST GADOBUTROL 1 MMOL/ML IV SOLN COMPARISON:  MRI head Mar 29, 2021. FINDINGS: MRA NECK FINDINGS Aorta: Great vessel origins are he tends. Carotids: No evidence of significant (greater than 50%) stenosis. Vertebral arteries: Codominant. No evidence of significant (greater than 50%) stenosis. MRA HEAD FINDINGS Anterior circulation: No large vessel occlusion or hemodynamically significant proximal stenosis. Small (1-2 mm) outpouching arising from the left paraclinoid ICA (series 1, image 22 and series 1060 image 171). Posterior circulation: Bilateral intradural vertebral arteries, basilar artery, and the posterior cerebral arteries are patent without evidence of proximal hemodynamically significant stenosis. Mild narrowing of the distal basilar artery and right P2 PCA. IMPRESSION: 1. No large vessel occlusion or hemodynamically significant stenosis in the head or neck. 2. Small (1-2 mm) outpouching arising from the left paraclinoid ICA, which may represent a small aneurysm versus infundibulum with vessel too small to characterize by MRA. A follow-up CTA could further characterize. Electronically Signed   By: Feliberto Harts MD   On: 03/30/2021 17:57   MR BRAIN WO CONTRAST  Result Date: 03/29/2021 CLINICAL DATA:  Initial evaluation for acute cranial 7 neuropathy. Left-sided facial droop with right sided blurry vision. EXAM: MRI HEAD WITHOUT CONTRAST TECHNIQUE: Multiplanar, multiecho pulse sequences of the brain and surrounding structures were obtained without intravenous contrast. COMPARISON:  Prior head CT from earlier the same day. FINDINGS: Brain: Mildly advanced cerebral atrophy for age with scattered prominence of the perivascular spaces. Patchy T2/FLAIR hyperintensity involving the periventricular white  matter, most consistent with chronic small vessel ischemic disease, also advanced for age. Few scattered remote lacunar infarcts present about the bilateral basal ganglia and right thalamus. Small chronic microhemorrhage at the right thalamus likely related to small vessel disease. 1.2 cm linear focus of restricted diffusion involving the left paramedian pons, consistent with an acute small vessel type infarct (series 5, image 13). This likely accounts for the patient's symptoms given location. No associated hemorrhage or mass effect. There is an additional 6 mm acute ischemic nonhemorrhagic infarct at the posterior left lentiform nucleus (series 5, image 24). No associated mass effect. No other evidence for acute or subacute ischemia. Gray-white matter differentiation otherwise maintained. No other areas of remote cortical infarction. No acute intracranial hemorrhage. No mass lesion, midline shift or mass effect. No hydrocephalus or extra-axial fluid collection. Pituitary gland suprasellar region normal. Midline structures intact. Vascular: Major intracranial vascular flow voids are maintained. Skull and upper cervical spine: Craniocervical junction within normal limits. Bone marrow signal intensity normal. No scalp soft tissue abnormality. Sinuses/Orbits: Globes and orbital soft tissues within normal limits. Paranasal sinuses are clear. No mastoid effusion. Inner ear structures within normal limits. Other: None. IMPRESSION: 1. 1.2 cm acute ischemic nonhemorrhagic infarct involving the left paramedian pons. This likely accounts for the patient's symptoms given location. 2. Additional 6 mm acute ischemic nonhemorrhagic infarct at the posterior left lentiform nucleus. 3. Underlying age-related cerebral atrophy with chronic microvascular ischemic disease, with a few additional remote lacunar infarcts about the deep gray nuclei. Overall, appearance is advanced for age. Electronically Signed   By: Rise Mu  M.D.   On: 03/29/2021 23:49   CT HEAD CODE STROKE WO CONTRAST  Result Date: 03/29/2021 CLINICAL DATA:  Code stroke. Initial evaluation for acute blurry vision. EXAM: CT HEAD WITHOUT CONTRAST TECHNIQUE: Contiguous axial images were obtained from the base of the skull through the vertex without intravenous contrast. COMPARISON:  None. FINDINGS: Brain: Mildly advanced cerebral volume loss for age. Few scatter remote lacunar  infarcts noted about the bilateral basal ganglia and right thalamus. Patchy hypodensity seen involving the supratentorial cerebral white matter, nonspecific, but most likely related chronic microvascular ischemic disease given the presence of the lacunar infarcts. No acute intracranial hemorrhage. No acute large vessel territory infarct. No mass lesion or midline shift. No hydrocephalus or extra-axial fluid collection. Vascular: No hyperdense vessel. Skull: Scalp soft tissues and calvarium within normal limits. Sinuses/Orbits: Conjugate right gaze noted. Globes and orbital soft tissues demonstrate no other acute finding. Paranasal sinuses are clear. No mastoid effusion. Other: None. ASPECTS [facility] Stroke Program Early CT Score) - Ganglionic level infarction (caudate, lentiform nuclei, internal capsule, insula, M1-M3 cortex): 7 - Supraganglionic infarction (M4-M6 cortex): 3 Total score (0-10 with 10 being normal): 10 IMPRESSION: 1. No acute intracranial infarct or other abnormality. 2. ASPECTS is 10. 3. Multiple remote lacunar infarcts about the deep gray nuclei, with probable chronic microvascular ischemic disease involving the supratentorial cerebral white matter, advanced for age. Critical Value/emergent results were called by telephone at the time of interpretation on 03/29/2021 at 10:02 pm to provider MARK QUALE , who verbally acknowledged these results. Electronically Signed   By: Rise Mu M.D.   On: 03/29/2021 22:06    ECG & Cardiac Imaging    RSR, 81, LVH w/ repol  abnormalities (inflat ST dep/TWI)  - personally reviewed.  Assessment & Plan    1.  Stroke:  Pt presented 5/27 w/ a 1 day h/o left facial droop, visual disturbance, and unsteady gait.  MRI consistent w/ acute stroke.  He has been markedly hypertensive (see below).  On asa/plavix/statin. Mgmt per neuro.  2.  Abnl ECG:  Asked to see pt due to abnl ECG on admission and on repeat today.  He has no h/o chest pain or dyspnea.  Echo shows nl LVEF w/o rwma and mod LVH.  ECG w/ inferolateral ST dep and TWI, which is most likely consistent w/ repolarization abnormalities in the setting of LVH.  No further w/u warranted @ this time.  3.  Essential HTN:  Pt reports compliance w/ amlodipine, hydralazine, and labetalol @ home, however BPs markedly elevated on admission and since admission, in the setting of permissive HTN @ this time.  If not prev performed, renal artery duplex should be undertaken at some point.  Defer BP mgmt to IM and neuro.  Certainly room to improve pressures as he cont to trend 170's to 2-teens.  Currenly on amlodipine 10 and labetalol 10 IV q 4h, though labetalol 100 bid scheduled to start this evening.  PRN hydralazine ordered.  It appears he was prev taking  TID.  4.  CKD III:  Creat appears to be @ baseline @ 1.93 this AM.  Consider renal artery duplex as outpt.  5. HL:  Statin started in setting of CVA.  Signed, Nicolasa Ducking, NP 03/31/2021, 3:53 PM  For questions or updates, please contact   Please consult [email or web address] for contact info under Cardiology/STEMI.

## 2021-03-31 NOTE — Progress Notes (Signed)
   03/31/21 1556  Vitals  Temp 99.3 F (37.4 C)  Temp Source Oral  BP (!) 187/125  BP Location Right Arm  BP Method Automatic  Patient Position (if appropriate) Sitting  Pulse Rate (!) 105  Pulse Rate Source Monitor  ECG Heart Rate (!) 105  Resp (!) 25  Level of Consciousness  Level of Consciousness Alert  MEWS COLOR  MEWS Score Color Yellow  Gave scheduled BP med. Paged MD lama V/S

## 2021-03-31 NOTE — Progress Notes (Signed)
   03/31/21 1252  Assess: MEWS Score  Temp 99.6 F (37.6 C)  BP (!) 176/123  Pulse Rate (!) 108  Resp (!) 25  Level of Consciousness Alert  SpO2 100 %  O2 Device Room Air  Assess: MEWS Score  MEWS Temp 0  MEWS Systolic 0  MEWS Pulse 1  MEWS RR 1  MEWS LOC 0  MEWS Score 2  MEWS Score Color Yellow  Assess: if the MEWS score is Yellow or Red  Were vital signs taken at a resting state? Yes  Focused Assessment No change from prior assessment  Early Detection of Sepsis Score *See Row Information* High  MEWS guidelines implemented *See Row Information* Yes  Treat  MEWS Interventions Administered scheduled meds/treatments  Pain Scale 0-10  Pain Score 0  Take Vital Signs  Increase Vital Sign Frequency  Yellow: Q 2hr X 2 then Q 4hr X 2, if remains yellow, continue Q 4hrs  Escalate  MEWS: Escalate Yellow: discuss with charge nurse/RN and consider discussing with provider and RRT  Notify: Charge Nurse/RN  Name of Charge Nurse/RN Notified Malka  Date Charge Nurse/RN Notified 03/31/21  Time Charge Nurse/RN Notified 1333  Notify: Provider  Provider Name/Title Lama  Date Provider Notified 03/31/21  Time Provider Notified 1333  Notification Type Page  Notification Reason Change in status  Provider response See new orders (EKG/ PRN BP med)  Date of Provider Response 03/31/21  Time of Provider Response 1334  Document  Patient Outcome Other (Comment) (will contiue to monitor)

## 2021-03-31 NOTE — Progress Notes (Signed)
Patient's mother, Little Ishikawa, would like phone call from MD with plan of care update Monday. [phone]

## 2021-03-31 NOTE — Progress Notes (Signed)
[facility]  PROGRESS NOTE  Jeremy Hudson JEH:[id number] DOB: [birth year] DOA: 03/29/2021 PCP: [facility]   Brief HPI:   *40 year old male with medical history of hypertension, CKD stage III presented to ED with complaints of left facial droop, right blurry vision.  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  Patient started on aspirin and Plavix.  Echocardiogram ordered.  MRA head and neck ordered.  Neurology consulted.    Subjective   Patient seen and examined, continues to have left-sided facial weakness.  Developed temperature last night.  T-max 101.  Blood cultures x2 drawn this morning.  UA was clear, chest x-ray showed no acute abnormality.   Assessment/Plan:     Stroke Presented with left-sided facial weakness, left facial droop.   MRI brain showed acute ischemic nonhemorrhagic infarct Started on aspirin and Plavix Echocardiogram shows grade 1 diastolic dysfunction, EF 60 to 65% MRA head and neck showed no large vessel occlusion, carotids showed no evidence of significant stenosis Neurology following    Hypertension Blood pressure is elevated Permissive hypertension Started on labetalol 10 mg IV every 4 hours as needed for SBP > 220,  DBP>110    Fever T-max 101 last night. Asymptomatic UA clear, chest x-ray showed no acute abnormality Blood cultures x2 obtained, follow results Will obtain CBC today    Acute kidney injury on CKD stage IIIb Baseline creatinine 1.94-2.0 Presented with creatinine of 2.30 Improved to 1.93, back to baseline   Hypokalemia Potassium was 2.6 yesterday Replete, potassium is 3.5 today. Serum magnesium 1.9      Scheduled medications:   . aspirin EC  81 mg Oral Daily  . clopidogrel  75 mg Oral Daily  . heparin  5,000 Units Subcutaneous Q8H         Data Reviewed:   CBG:  Recent Labs  Lab 03/29/21 2134  03/29/21 2151  GLUCAP 143* 111*    SpO2: 100 %    Vitals:   03/31/21 0654 03/31/21 0758 03/31/21 0929 03/31/21 1030  BP: (!) 177/123 (!) 177/129 (!) 172/111 (!) 168/118  Pulse: (!) 109 (!) 108    Resp: 20 20    Temp: 99.2 F (37.3 C) 99.1 F (37.3 C)  99.5 F (37.5 C)  TempSrc: Oral Oral  Oral  SpO2: 100% 100%    Weight:      Height:         Intake/Output Summary (Last 24 hours) at 03/31/2021 1153 Last data filed at 03/31/2021 1022 Gross per 24 hour  Intake 1131.78 ml  Output --  Net 1131.78 ml    05/27 1901 - 05/29 0700 In: 1053.9 [P.O.:120; I.V.:638.6] Out: 350 [Urine:350]  Filed Weights   03/29/21 2122 03/30/21 0215  Weight: 100.7 kg 101.9 kg    CBC:  Recent Labs  Lab 03/29/21 2156 03/30/21 0524  WBC 11.1* 10.8*  HGB 14.6 13.8  HCT 43.9 39.4  PLT 386 366  MCV 81.3 78.8*  MCH 27.0 27.6  MCHC 33.3 35.0  RDW 14.7 14.5  LYMPHSABS 2.9  --   MONOABS 1.1*  --   EOSABS 0.2  --   BASOSABS 0.1  --     Complete metabolic panel:  Recent Labs  Lab 03/29/21 2156 03/30/21 0524 03/30/21 1453 03/31/21 0449  NA 137 136  --  137  K 3.2* 2.6*  --  3.5  CL 103 109  --  106  CO2 22 17*  --  19*  GLUCOSE 95 108*  --  112*  BUN 25* 26*  --  21*  CREATININE 2.30* 2.15*  --  1.93*  CALCIUM 10.1 9.4  --  9.8  AST 37  --   --   --   ALT 59*  --   --   --   ALKPHOS 98  --   --   --   BILITOT 1.1  --   --   --   ALBUMIN 4.6  --   --   --   MG  --   --  1.9  --   INR 1.0  --   --   --     No results for input(s): LIPASE, AMYLASE in the last 168 hours.  Recent Labs  Lab 03/30/21 0100  SARSCOV2NAA NEGATIVE    ------------------------------------------------------------------------------------------------------------------ Recent Labs    03/30/21 0524  CHOL 189  HDL 28*  LDLCALC 101*  TRIG 302*  CHOLHDL 6.8    No results found for:  HGBA1C ------------------------------------------------------------------------------------------------------------------ No results for input(s): TSH, T4TOTAL, T3FREE, THYROIDAB in the last 72 hours.  Invalid input(s): FREET3 ------------------------------------------------------------------------------------------------------------------ No results for input(s): VITAMINB12, FOLATE, FERRITIN, TIBC, IRON, RETICCTPCT in the last 72 hours.  Coagulation profile Recent Labs  Lab 03/29/21 2156  INR 1.0   No results for input(s): DDIMER in the last 72 hours.  Cardiac Enzymes No results for input(s): CKTOTAL, CKMB, CKMBINDEX, TROPONINI in the last 168 hours.  ------------------------------------------------------------------------------------------------------------------ No results found for: BNP   Antibiotics: Anti-infectives (From admission, onward)   Start     Dose/Rate Route Frequency Ordered Stop   03/29/21 0000  valACYclovir (VALTREX) 1000 MG tablet        1,000 mg Oral 2 times daily 03/29/21 2327 04/08/21 2359       Radiology Reports  DG Chest 1 View  Result Date: 03/31/2021 CLINICAL DATA:  Fever EXAM: CHEST  1 VIEW COMPARISON:  01/05/2015 FINDINGS: Normal heart size and mediastinal contours. Low volume chest. No acute infiltrate or edema. No effusion or pneumothorax. No acute osseous findings. IMPRESSION: Negative for pneumonia Electronically Signed   By: Marnee Spring M.D.   On: 03/31/2021 07:13   MR ANGIO HEAD WO CONTRAST  Result Date: 03/30/2021 CLINICAL DATA:  Stroke follow-up. EXAM: MRA NECK WITHOUT AND WITH CONTRAST MRA HEAD WITHOUT CONTRAST TECHNIQUE: Multiplanar and multiecho pulse sequences of the neck were obtained without and with intravenous contrast. Angiographic images of the neck were obtained using MRA technique without and with intravenous contrast; Angiographic images of the Circle of Willis were obtained using MRA technique without intravenous contrast.  CONTRAST:  34mL GADAVIST GADOBUTROL 1 MMOL/ML IV SOLN COMPARISON:  MRI head Mar 29, 2021. FINDINGS: MRA NECK FINDINGS Aorta: Great vessel origins are he tends. Carotids: No evidence of significant (greater than 50%) stenosis. Vertebral arteries: Codominant. No evidence of significant (greater than 50%) stenosis. MRA HEAD FINDINGS Anterior circulation: No large vessel occlusion or hemodynamically significant proximal stenosis. Small (1-2 mm) outpouching arising from the left paraclinoid ICA (series 1, image 22 and series 1060 image 171). Posterior circulation: Bilateral intradural vertebral arteries, basilar artery, and the posterior cerebral arteries are patent without evidence of proximal hemodynamically significant stenosis. Mild narrowing of the distal basilar artery and right P2 PCA. IMPRESSION: 1. No large vessel occlusion or hemodynamically significant stenosis in the head or neck. 2. Small (1-2 mm) outpouching arising from the left paraclinoid ICA, which may represent a small aneurysm versus infundibulum with vessel too small to characterize  by MRA. A follow-up CTA could further characterize. Electronically Signed   By: Feliberto Harts MD   On: 03/30/2021 17:57   MR ANGIO NECK W WO CONTRAST  Result Date: 03/30/2021 CLINICAL DATA:  Stroke follow-up. EXAM: MRA NECK WITHOUT AND WITH CONTRAST MRA HEAD WITHOUT CONTRAST TECHNIQUE: Multiplanar and multiecho pulse sequences of the neck were obtained without and with intravenous contrast. Angiographic images of the neck were obtained using MRA technique without and with intravenous contrast; Angiographic images of the Circle of Willis were obtained using MRA technique without intravenous contrast. CONTRAST:  76mL GADAVIST GADOBUTROL 1 MMOL/ML IV SOLN COMPARISON:  MRI head Mar 29, 2021. FINDINGS: MRA NECK FINDINGS Aorta: Great vessel origins are he tends. Carotids: No evidence of significant (greater than 50%) stenosis. Vertebral arteries: Codominant. No evidence  of significant (greater than 50%) stenosis. MRA HEAD FINDINGS Anterior circulation: No large vessel occlusion or hemodynamically significant proximal stenosis. Small (1-2 mm) outpouching arising from the left paraclinoid ICA (series 1, image 22 and series 1060 image 171). Posterior circulation: Bilateral intradural vertebral arteries, basilar artery, and the posterior cerebral arteries are patent without evidence of proximal hemodynamically significant stenosis. Mild narrowing of the distal basilar artery and right P2 PCA. IMPRESSION: 1. No large vessel occlusion or hemodynamically significant stenosis in the head or neck. 2. Small (1-2 mm) outpouching arising from the left paraclinoid ICA, which may represent a small aneurysm versus infundibulum with vessel too small to characterize by MRA. A follow-up CTA could further characterize. Electronically Signed   By: Feliberto Harts MD   On: 03/30/2021 17:57   MR BRAIN WO CONTRAST  Result Date: 03/29/2021 CLINICAL DATA:  Initial evaluation for acute cranial 7 neuropathy. Left-sided facial droop with right sided blurry vision. EXAM: MRI HEAD WITHOUT CONTRAST TECHNIQUE: Multiplanar, multiecho pulse sequences of the brain and surrounding structures were obtained without intravenous contrast. COMPARISON:  Prior head CT from earlier the same day. FINDINGS: Brain: Mildly advanced cerebral atrophy for age with scattered prominence of the perivascular spaces. Patchy T2/FLAIR hyperintensity involving the periventricular white matter, most consistent with chronic small vessel ischemic disease, also advanced for age. Few scattered remote lacunar infarcts present about the bilateral basal ganglia and right thalamus. Small chronic microhemorrhage at the right thalamus likely related to small vessel disease. 1.2 cm linear focus of restricted diffusion involving the left paramedian pons, consistent with an acute small vessel type infarct (series 5, image 13). This likely accounts  for the patient's symptoms given location. No associated hemorrhage or mass effect. There is an additional 6 mm acute ischemic nonhemorrhagic infarct at the posterior left lentiform nucleus (series 5, image 24). No associated mass effect. No other evidence for acute or subacute ischemia. Gray-white matter differentiation otherwise maintained. No other areas of remote cortical infarction. No acute intracranial hemorrhage. No mass lesion, midline shift or mass effect. No hydrocephalus or extra-axial fluid collection. Pituitary gland suprasellar region normal. Midline structures intact. Vascular: Major intracranial vascular flow voids are maintained. Skull and upper cervical spine: Craniocervical junction within normal limits. Bone marrow signal intensity normal. No scalp soft tissue abnormality. Sinuses/Orbits: Globes and orbital soft tissues within normal limits. Paranasal sinuses are clear. No mastoid effusion. Inner ear structures within normal limits. Other: None. IMPRESSION: 1. 1.2 cm acute ischemic nonhemorrhagic infarct involving the left paramedian pons. This likely accounts for the patient's symptoms given location. 2. Additional 6 mm acute ischemic nonhemorrhagic infarct at the posterior left lentiform nucleus. 3. Underlying age-related cerebral atrophy with chronic microvascular ischemic  disease, with a few additional remote lacunar infarcts about the deep gray nuclei. Overall, appearance is advanced for age. Electronically Signed   By: Rise Mu M.D.   On: 03/29/2021 23:49   ECHOCARDIOGRAM COMPLETE  Result Date: 03/30/2021    ECHOCARDIOGRAM REPORT   Patient Name:   Jeremy Hudson Date of Exam: 03/30/2021 Medical Rec #:  [id number]       Height:       63.0 in Accession #:    [id number]      Weight:       224.6 lb Date of Birth:  [birth year]       BSA:          2.032 m Patient Age:    39 years        BP:           132/118 mmHg Patient Gender: M               HR:           75 bpm. Exam Location:   [facility] Procedure: 2D Echo and Strain Analysis Indications:     Stroke I63.9  History:         Patient has no prior history of Echocardiogram examinations.  Sonographer:     Overton Mam RDCS Referring Phys:  [id number] Floreen Comber PATEL Diagnosing Phys: Julien Nordmann MD  Sonographer Comments: No subcostal window. Global longitudinal strain was attempted. IMPRESSIONS  1. Left ventricular ejection fraction, by estimation, is 60 to 65%. The left ventricle has normal function. The left ventricle has no regional wall motion abnormalities. There is moderate left ventricular hypertrophy. Left ventricular diastolic parameters are consistent with Grade I diastolic dysfunction (impaired relaxation). The average left ventricular global longitudinal strain is -17.3 %. The global longitudinal strain is normal.  2. Right ventricular systolic function is normal. The right ventricular size is normal. Tricuspid regurgitation signal is inadequate for assessing PA pressure.  3. The mitral valve is normal in structure. Mild mitral valve regurgitation. FINDINGS  Left Ventricle: Left ventricular ejection fraction, by estimation, is 60 to 65%. The left ventricle has normal function. The left ventricle has no regional wall motion abnormalities. The average left ventricular global longitudinal strain is -17.3 %. The global longitudinal strain is normal. The left ventricular internal cavity size was normal in size. There is moderate left ventricular hypertrophy. Left ventricular diastolic parameters are consistent with Grade I diastolic dysfunction (impaired relaxation). Right Ventricle: The right ventricular size is normal. No increase in right ventricular wall thickness. Right ventricular systolic function is normal. Tricuspid regurgitation signal is inadequate for assessing PA pressure. Left Atrium: Left atrial size was normal in size. Right Atrium: Right atrial size was normal in size. Pericardium: There is no evidence of pericardial  effusion. Mitral Valve: The mitral valve is normal in structure. Mild mitral valve regurgitation. No evidence of mitral valve stenosis. Tricuspid Valve: The tricuspid valve is normal in structure. Tricuspid valve regurgitation is not demonstrated. No evidence of tricuspid stenosis. Aortic Valve: The aortic valve is normal in structure. Aortic valve regurgitation is not visualized. No aortic stenosis is present. Aortic valve peak gradient measures 12.2 mmHg. Pulmonic Valve: The pulmonic valve was normal in structure. Pulmonic valve regurgitation is not visualized. No evidence of pulmonic stenosis. Aorta: The aortic root is normal in size and structure. Venous: The inferior vena cava is normal in size with greater than 50% respiratory variability, suggesting right atrial pressure of 3 mmHg. IAS/Shunts: No atrial level shunt  detected by color flow Doppler.  LEFT VENTRICLE PLAX 2D LVIDd:         4.71 cm  Diastology LVIDs:         3.12 cm  LV e' medial:    8.59 cm/s LV PW:         1.51 cm  LV E/e' medial:  8.3 LV IVS:        1.60 cm  LV e' lateral:   9.03 cm/s LVOT diam:     2.20 cm  LV E/e' lateral: 7.9 LV SV:         74 LV SV Index:   36       2D Longitudinal Strain LVOT Area:     3.80 cm 2D Strain GLS Avg:     -17.3 %  RIGHT VENTRICLE RV Basal diam:  2.53 cm RV S prime:     16.30 cm/s TAPSE (M-mode): 2.8 cm LEFT ATRIUM             Index       RIGHT ATRIUM           Index LA diam:        3.50 cm 1.72 cm/m  RA Area:     11.10 cm LA Vol (A2C):   21.8 ml 10.73 ml/m RA Volume:   22.50 ml  11.08 ml/m LA Vol (A4C):   18.9 ml 9.30 ml/m LA Biplane Vol: 22.4 ml 11.03 ml/m  AORTIC VALVE                PULMONIC VALVE AV Area (Vmax): 2.39 cm    PV Vmax:       1.29 m/s AV Vmax:        175.00 cm/s PV Peak grad:  6.7 mmHg AV Peak Grad:   12.2 mmHg LVOT Vmax:      110.00 cm/s LVOT Vmean:     81.200 cm/s LVOT VTI:       0.195 m                             PULMONARY ARTERY AORTA                       MPA diam:        1.90 cm Ao  Root diam: 3.00 cm Ao Asc diam:  2.60 cm MITRAL VALVE               TRICUSPID VALVE MV Area (PHT): 3.05 cm    TV Peak grad:   26.0 mmHg MV Decel Time: 249 msec    TV Vmax:        2.55 m/s MV E velocity: 71.60 cm/s MV A velocity: 66.40 cm/s  SHUNTS MV E/A ratio:  1.08        Systemic VTI:  0.20 m                            Systemic Diam: 2.20 cm Julien Nordmannimothy Gollan MD Electronically signed by Julien Nordmannimothy Gollan MD Signature Date/Time: 03/30/2021/1:03:10 PM    Final    CT HEAD CODE STROKE WO CONTRAST  Result Date: 03/29/2021 CLINICAL DATA:  Code stroke. Initial evaluation for acute blurry vision. EXAM: CT HEAD WITHOUT CONTRAST TECHNIQUE: Contiguous axial images were obtained from the base of the skull through the vertex without intravenous contrast. COMPARISON:  None. FINDINGS: Brain: Mildly advanced cerebral volume loss  for age. Few scatter remote lacunar infarcts noted about the bilateral basal ganglia and right thalamus. Patchy hypodensity seen involving the supratentorial cerebral white matter, nonspecific, but most likely related chronic microvascular ischemic disease given the presence of the lacunar infarcts. No acute intracranial hemorrhage. No acute large vessel territory infarct. No mass lesion or midline shift. No hydrocephalus or extra-axial fluid collection. Vascular: No hyperdense vessel. Skull: Scalp soft tissues and calvarium within normal limits. Sinuses/Orbits: Conjugate right gaze noted. Globes and orbital soft tissues demonstrate no other acute finding. Paranasal sinuses are clear. No mastoid effusion. Other: None. ASPECTS [facility] Stroke Program Early CT Score) - Ganglionic level infarction (caudate, lentiform nuclei, internal capsule, insula, M1-M3 cortex): 7 - Supraganglionic infarction (M4-M6 cortex): 3 Total score (0-10 with 10 being normal): 10 IMPRESSION: 1. No acute intracranial infarct or other abnormality. 2. ASPECTS is 10. 3. Multiple remote lacunar infarcts about the deep gray nuclei, with  probable chronic microvascular ischemic disease involving the supratentorial cerebral white matter, advanced for age. Critical Value/emergent results were called by telephone at the time of interpretation on 03/29/2021 at 10:02 pm to provider MARK QUALE , who verbally acknowledged these results. Electronically Signed   By: Rise Mu M.D.   On: 03/29/2021 22:06      DVT prophylaxis: Heparin  Code Status: Full code  Family Communication: No family at bedside   Consultants:  Neurology  Procedures:      Objective    Physical Examination:    General-appears in no acute distress  Heart-S1-S2, regular, no murmur auscultated  Lungs-clear to auscultation bilaterally, no wheezing or crackles auscultated  Abdomen-soft, nontender, no organomegaly  Extremities-no edema in the lower extremities  Neuro-alert, oriented x3, left-sided facial droop, motor strength 5/5 in all extremities   Status is: Inpatient  Dispo: The patient is from: Home              Anticipated d/c is to: Home              Anticipated d/c date is: 03/22/2021              Patient currently not stable for discharge  Barrier to discharge-ongoing evaluation for stroke  COVID-19 Labs  No results for input(s): DDIMER, FERRITIN, LDH, CRP in the last 72 hours.  Lab Results  Component Value Date   SARSCOV2NAA NEGATIVE 03/30/2021    Microbiology  Recent Results (from the past 240 hour(s))  Resp Panel by RT-PCR (Flu A&B, Covid) Nasopharyngeal Swab     Status: None   Collection Time: 03/30/21  1:00 AM   Specimen: Nasopharyngeal Swab; Nasopharyngeal(NP) swabs in vial transport medium  Result Value Ref Range Status   SARS Coronavirus 2 by RT PCR NEGATIVE NEGATIVE Final    Comment: (NOTE) SARS-CoV-2 target nucleic acids are NOT DETECTED.  The SARS-CoV-2 RNA is generally detectable in upper respiratory specimens during the acute phase of infection. The lowest concentration of SARS-CoV-2 viral  copies this assay can detect is 138 copies/mL. A negative result does not preclude SARS-Cov-2 infection and should not be used as the sole basis for treatment or other patient management decisions. A negative result may occur with  improper specimen collection/handling, submission of specimen other than nasopharyngeal swab, presence of viral mutation(s) within the areas targeted by this assay, and inadequate number of viral copies(<138 copies/mL). A negative result must be combined with clinical observations, patient history, and epidemiological information. The expected result is Negative.  Fact Sheet for Patients:  BloggerCourse.com  Fact Sheet  for Healthcare Providers:  SeriousBroker.it  This test is no t yet approved or cleared by the [place] and  has been authorized for detection and/or diagnosis of SARS-CoV-2 by FDA under an Emergency Use Authorization (EUA). This EUA will remain  in effect (meaning this test can be used) for the duration of the COVID-19 declaration under Section 564(b)(1) of the Act, 21 U.S.C.section 360bbb-3(b)(1), unless the authorization is terminated  or revoked sooner.       Influenza A by PCR NEGATIVE NEGATIVE Final   Influenza B by PCR NEGATIVE NEGATIVE Final    Comment: (NOTE) The Xpert Xpress SARS-CoV-2/FLU/RSV plus assay is intended as an aid in the diagnosis of influenza from Nasopharyngeal swab specimens and should not be used as a sole basis for treatment. Nasal washings and aspirates are unacceptable for Xpert Xpress SARS-CoV-2/FLU/RSV testing.  Fact Sheet for Patients: BloggerCourse.com  Fact Sheet for Healthcare Providers: SeriousBroker.it  This test is not yet approved or cleared by the [place] FDA and has been authorized for detection and/or diagnosis of SARS-CoV-2 by FDA under an Emergency Use Authorization (EUA). This  EUA will remain in effect (meaning this test can be used) for the duration of the COVID-19 declaration under Section 564(b)(1) of the Act, 21 U.S.C. section 360bbb-3(b)(1), unless the authorization is terminated or revoked.  Performed at [facility], [address] [place], [place] [address]              Meredeth Ide   [facility] If 7PM-7AM, please contact night-coverage at [email or web address], Office  [phone]   03/31/2021, 11:53 AM  LOS: 0 days

## 2021-03-31 NOTE — Progress Notes (Signed)
Patient BP and and Temp elevated. He complains of chills. He is a Red 5 MEWS, following protocol. Provider notified. Patient denies pain, is Alert and oriented x4, and VS were taken at a resting state. Patient appears in no acute distress. - nothing follows   03/31/21 0444  Assess: MEWS Score  Temp (!) 101 F (38.3 C)  BP (!) 217/128  Pulse Rate (!) 117  Resp 16  Level of Consciousness Alert  SpO2 100 %  O2 Device Room Air  Assess: MEWS Score  MEWS Temp 1  MEWS Systolic 2  MEWS Pulse 2  MEWS RR 0  MEWS LOC 0  MEWS Score 5  MEWS Score Color Red  Assess: if the MEWS score is Yellow or Red  Were vital signs taken at a resting state? Yes  Focused Assessment No change from prior assessment  Early Detection of Sepsis Score *See Row Information* High  MEWS guidelines implemented *See Row Information* Yes  Treat  MEWS Interventions Administered prn meds/treatments;Other (Comment) Psychologist, occupational and Provider on call notified)  Pain Scale 0-10  Pain Score 0  Complains of Anxiety;Other (Comment) (chills)  Neuro symptoms relieved by Anti-anxiety medication  Take Vital Signs  Increase Vital Sign Frequency  Red: Q 1hr X 4 then Q 4hr X 4, if remains red, continue Q 4hrs  Escalate  MEWS: Escalate Red: discuss with charge nurse/RN and provider, consider discussing with RRT  Notify: Charge Nurse/RN  Name of Charge Nurse/RN Notified Marcel, RN  Date Charge Nurse/RN Notified 03/31/21  Time Charge Nurse/RN Notified 0450  Notify: Provider  Provider Name/Title Audrea Muscat, NP  Date Provider Notified 03/31/21  Time Provider Notified 0500  Notification Type Page  Notification Reason Other (Comment) (change in VS, Red MEWS)

## 2021-04-01 LAB — CBC WITH DIFFERENTIAL/PLATELET
Abs Immature Granulocytes: 0.06 10*3/uL (ref 0.00–0.07)
Basophils Absolute: 0.1 10*3/uL (ref 0.0–0.1)
Basophils Relative: 0 %
Eosinophils Absolute: 0.2 10*3/uL (ref 0.0–0.5)
Eosinophils Relative: 1 %
HCT: 41.8 % (ref 39.0–52.0)
Hemoglobin: 13.9 g/dL (ref 13.0–17.0)
Immature Granulocytes: 0 %
Lymphocytes Relative: 14 %
Lymphs Abs: 2 10*3/uL (ref 0.7–4.0)
MCH: 27 pg (ref 26.0–34.0)
MCHC: 33.3 g/dL (ref 30.0–36.0)
MCV: 81.2 fL (ref 80.0–100.0)
Monocytes Absolute: 1.3 10*3/uL — ABNORMAL HIGH (ref 0.1–1.0)
Monocytes Relative: 9 %
Neutro Abs: 11 10*3/uL — ABNORMAL HIGH (ref 1.7–7.7)
Neutrophils Relative %: 76 %
Platelets: 359 10*3/uL (ref 150–400)
RBC: 5.15 MIL/uL (ref 4.22–5.81)
RDW: 14.6 % (ref 11.5–15.5)
WBC: 14.5 10*3/uL — ABNORMAL HIGH (ref 4.0–10.5)
nRBC: 0 % (ref 0.0–0.2)

## 2021-04-01 MED ORDER — ATORVASTATIN CALCIUM 20 MG PO TABS: 40.0000 mg | ORAL_TABLET | Freq: Every day | ORAL | 2 refills | Status: AC

## 2021-04-01 MED ORDER — VALACYCLOVIR HCL 1 G PO TABS: 1000.0000 mg | ORAL_TABLET | Freq: Two times a day (BID) | ORAL | 0 refills | Status: AC

## 2021-04-01 MED ORDER — POLYVINYL ALCOHOL 1.4 % OP SOLN: 1.0000 [drp] | OPHTHALMIC | 0 refills | Status: AC | PRN

## 2021-04-01 MED ORDER — POLYVINYL ALCOHOL 1.4 % OP SOLN: 1.0000 [drp] | OPHTHALMIC | 0 refills | Status: DC | PRN

## 2021-04-01 MED ORDER — CLOPIDOGREL BISULFATE 75 MG PO TABS: 75.0000 mg | ORAL_TABLET | Freq: Every day | ORAL | 0 refills | Status: AC

## 2021-04-01 MED ORDER — POLYVINYL ALCOHOL 1.4 % OP SOLN
1.0000 [drp] | OPHTHALMIC | Status: DC | PRN
  Filled 2021-04-01: qty 15

## 2021-04-01 MED ORDER — ASPIRIN 81 MG PO TBEC: 81.0000 mg | DELAYED_RELEASE_TABLET | Freq: Every day | ORAL | 3 refills | Status: AC

## 2021-04-01 MED ORDER — ASPIRIN 81 MG PO TBEC: 81.0000 mg | DELAYED_RELEASE_TABLET | Freq: Every day | ORAL | 11 refills | Status: DC

## 2021-04-01 MED ORDER — CLOPIDOGREL BISULFATE 75 MG PO TABS: 75.0000 mg | ORAL_TABLET | Freq: Every day | ORAL | 0 refills | Status: DC

## 2021-04-01 MED ORDER — ATORVASTATIN CALCIUM 20 MG PO TABS: 40.0000 mg | ORAL_TABLET | Freq: Every day | ORAL | 2 refills | Status: DC

## 2021-04-01 NOTE — Discharge Summary (Addendum)
Physician Discharge Summary  Jeremy Hudson LNL:[id number] DOB: [birth year] DOA: 03/29/2021  PCP: [facility] [place] date: 03/29/2021 Discharge date: 04/01/2021  Time spent: 50 minutes  Recommendations for Outpatient Follow-up:   1. Follow-up with neurology as outpatient for stroke follow-up  2. As per neurology patient will need following at outpatient neurology office  Refer to neurology for f/u for further evaluation of possible 1-31m vessel outpouching seen on MRA H&N. This would not be intervenable at this time and given the current severe shortage of iodinated contrast this can be deferred to the outpatient setting  Outpatient neurologist may consider hypercoag w/u given stroke in patient <40; I will defer this to outpatient setting bc studies ordered here would not result prior to d/c and should be followed up on by neurologist   Discharge Diagnoses:  Principal Problem:   Acute CVA (cerebrovascular accident) ([facility] Active Problems:   Hypertension   CKD (chronic kidney disease), stage III ([place]   CVA (cerebral vascular accident) ([facility]   Discharge Condition: Stable COPD  Diet recommendation: Heart healthy diet  Filed Weights   03/29/21 2122 03/30/21 0215  Weight: 100.7 kg 101.9 kg    History of present illness:  40year old male with medical history of hypertension, CKD stage III presented to ED with complaints of left facial droop, right blurry vision. 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 Patient started on aspirin and Plavix. Echocardiogram ordered. MRA head and neck ordered.  Neurology consulted.  Hospital Course:   Stroke Presented with left-sided facial weakness, left facial droop.   MRI brain showed acute ischemic nonhemorrhagic infarct Started on aspirin and Plavix Echocardiogram shows grade 1 diastolic dysfunction, EF 60 to 65% MRA head and neck  showed no large vessel occlusion, carotids showed no evidence of significant stenosis Neurology recommends to take aspirin and Plavix together for 21 days and then aspirin 81 mg daily alone. Will prescribe artificial tears to be used for left eye daily at bedtime.  Also provide eyepatch. He was initially prescribed prednisone for possible Bell's palsy.  Patient was not given prednisone during hospital stay.  Will discontinue prednisone at this time.    Hypertension Blood pressure was elevated, was started on labetalol as needed for SBP greater than 220/110 At this time neurology recommends to restart home medications for hypertension Continue taking home medications.    Fever Resolved, unclear etiology T-max 101 2 days ago Asymptomatic UA clear, chest x-ray showed no acute abnormality Blood cultures x2 are negative to date WBC was 21,000 yesterday, repeat CBC showed WBC of 14,000 today; likely from prednisone 50 mg which patient took for possible Bell's palsy    Acute kidney injury on CKD stage IIIb Baseline creatinine 1.94-2.0 Presented with creatinine of 2.30 Improved to 1.93, back to baseline   Hypokalemia Potassium was 2.6 yesterday Replete, potassium is 3.5 today. Serum magnesium 1.9  Procedures:  Echocardiogram  Consultations:  Neurology  Discharge Exam: Vitals:   04/01/21 1100 04/01/21 1224  BP: (!) 163/113 (!) 173/108  Pulse: 81 82  Resp:  18  Temp:    SpO2: 99% 100%    General: Appears in no acute distress Cardiovascular: S1-S2, regular Respiratory: Clear to auscultation bilaterally  Discharge Instructions   Discharge Instructions    Diet - low sodium heart healthy   Complete by: As directed    Increase activity slowly   Complete by: As directed      Allergies  as of 04/01/2021   No Known Allergies     Medication List    STOP taking these medications   oxyCODONE-acetaminophen 5-325 MG tablet Commonly known as: Roxicet      TAKE these medications   amLODipine 10 MG tablet Commonly known as: NORVASC Take 1 tablet (10 mg total) by mouth daily.   aspirin 81 MG EC tablet Take 1 tablet (81 mg total) by mouth daily. Swallow whole. Start taking on: Apr 02, 2021   atorvastatin 20 MG tablet Commonly known as: LIPITOR Take 2 tablets (40 mg total) by mouth daily.   Blood Pressure Kit 1 Units by Does not apply route daily.   clopidogrel 75 MG tablet Commonly known as: PLAVIX Take 1 tablet (75 mg total) by mouth daily for 21 days. Take Aspirin and Plavix for 21 days, then stop Plavix and take only Aspirin daily Start taking on: Apr 02, 2021   hydrALAZINE 100 MG tablet Commonly known as: APRESOLINE Take 1 tablet (100 mg total) by mouth every 8 (eight) hours.   hydrochlorothiazide 50 MG tablet Commonly known as: HYDRODIURIL Take 1 tablet (50 mg total) by mouth daily.   labetalol 100 MG tablet Commonly known as: NORMODYNE Take 1 tablet (100 mg total) by mouth 2 (two) times daily.   polyvinyl alcohol 1.4 % ophthalmic solution Commonly known as: LIQUIFILM TEARS Place 1 drop into the left eye as needed for dry eyes (At night time).   valACYclovir 1000 MG tablet Commonly known as: Valtrex Take 1 tablet (1,000 mg total) by mouth 2 (two) times daily for 10 days.      No Known Allergies  Follow-up Information    Schedule an appointment as soon as possible for a visit  with Vladimir Crofts, MD.   Specialty: Neurology Why: For ER visit follow-up and reevaluation. Contact information: [facility] [place] [address] [phone]                The results of significant diagnostics from this hospitalization (including imaging, microbiology, ancillary and laboratory) are listed below for reference.    Significant Diagnostic Studies: DG Chest 1 View  Result Date: 03/31/2021 CLINICAL DATA:  Fever EXAM: CHEST  1 VIEW COMPARISON:  01/05/2015 FINDINGS:  Normal heart size and mediastinal contours. Low volume chest. No acute infiltrate or edema. No effusion or pneumothorax. No acute osseous findings. IMPRESSION: Negative for pneumonia Electronically Signed   By: Monte Fantasia M.D.   On: 03/31/2021 07:13   MR ANGIO HEAD WO CONTRAST  Result Date: 03/30/2021 CLINICAL DATA:  Stroke follow-up. EXAM: MRA NECK WITHOUT AND WITH CONTRAST MRA HEAD WITHOUT CONTRAST TECHNIQUE: Multiplanar and multiecho pulse sequences of the neck were obtained without and with intravenous contrast. Angiographic images of the neck were obtained using MRA technique without and with intravenous contrast; Angiographic images of the Circle of Willis were obtained using MRA technique without intravenous contrast. CONTRAST:  71m GADAVIST GADOBUTROL 1 MMOL/ML IV SOLN COMPARISON:  MRI head Mar 29, 2021. FINDINGS: MRA NECK FINDINGS Aorta: Great vessel origins are he tends. Carotids: No evidence of significant (greater than 50%) stenosis. Vertebral arteries: Codominant. No evidence of significant (greater than 50%) stenosis. MRA HEAD FINDINGS Anterior circulation: No large vessel occlusion or hemodynamically significant proximal stenosis. Small (1-2 mm) outpouching arising from the left paraclinoid ICA (series 1, image 22 and series 1060 image 171). Posterior circulation: Bilateral intradural vertebral arteries, basilar artery, and the posterior cerebral arteries are patent without evidence of  proximal hemodynamically significant stenosis. Mild narrowing of the distal basilar artery and right P2 PCA. IMPRESSION: 1. No large vessel occlusion or hemodynamically significant stenosis in the head or neck. 2. Small (1-2 mm) outpouching arising from the left paraclinoid ICA, which may represent a small aneurysm versus infundibulum with vessel too small to characterize by MRA. A follow-up CTA could further characterize. Electronically Signed   By: Margaretha Sheffield MD   On: 03/30/2021 17:57   MR ANGIO  NECK W WO CONTRAST  Result Date: 03/30/2021 CLINICAL DATA:  Stroke follow-up. EXAM: MRA NECK WITHOUT AND WITH CONTRAST MRA HEAD WITHOUT CONTRAST TECHNIQUE: Multiplanar and multiecho pulse sequences of the neck were obtained without and with intravenous contrast. Angiographic images of the neck were obtained using MRA technique without and with intravenous contrast; Angiographic images of the Circle of Willis were obtained using MRA technique without intravenous contrast. CONTRAST:  38m GADAVIST GADOBUTROL 1 MMOL/ML IV SOLN COMPARISON:  MRI head Mar 29, 2021. FINDINGS: MRA NECK FINDINGS Aorta: Great vessel origins are he tends. Carotids: No evidence of significant (greater than 50%) stenosis. Vertebral arteries: Codominant. No evidence of significant (greater than 50%) stenosis. MRA HEAD FINDINGS Anterior circulation: No large vessel occlusion or hemodynamically significant proximal stenosis. Small (1-2 mm) outpouching arising from the left paraclinoid ICA (series 1, image 22 and series 1060 image 171). Posterior circulation: Bilateral intradural vertebral arteries, basilar artery, and the posterior cerebral arteries are patent without evidence of proximal hemodynamically significant stenosis. Mild narrowing of the distal basilar artery and right P2 PCA. IMPRESSION: 1. No large vessel occlusion or hemodynamically significant stenosis in the head or neck. 2. Small (1-2 mm) outpouching arising from the left paraclinoid ICA, which may represent a small aneurysm versus infundibulum with vessel too small to characterize by MRA. A follow-up CTA could further characterize. Electronically Signed   By: FMargaretha SheffieldMD   On: 03/30/2021 17:57   MR BRAIN WO CONTRAST  Result Date: 03/29/2021 CLINICAL DATA:  Initial evaluation for acute cranial 7 neuropathy. Left-sided facial droop with right sided blurry vision. EXAM: MRI HEAD WITHOUT CONTRAST TECHNIQUE: Multiplanar, multiecho pulse sequences of the brain and  surrounding structures were obtained without intravenous contrast. COMPARISON:  Prior head CT from earlier the same day. FINDINGS: Brain: Mildly advanced cerebral atrophy for age with scattered prominence of the perivascular spaces. Patchy T2/FLAIR hyperintensity involving the periventricular white matter, most consistent with chronic small vessel ischemic disease, also advanced for age. Few scattered remote lacunar infarcts present about the bilateral basal ganglia and right thalamus. Small chronic microhemorrhage at the right thalamus likely related to small vessel disease. 1.2 cm linear focus of restricted diffusion involving the left paramedian pons, consistent with an acute small vessel type infarct (series 5, image 13). This likely accounts for the patient's symptoms given location. No associated hemorrhage or mass effect. There is an additional 6 mm acute ischemic nonhemorrhagic infarct at the posterior left lentiform nucleus (series 5, image 24). No associated mass effect. No other evidence for acute or subacute ischemia. Gray-white matter differentiation otherwise maintained. No other areas of remote cortical infarction. No acute intracranial hemorrhage. No mass lesion, midline shift or mass effect. No hydrocephalus or extra-axial fluid collection. Pituitary gland suprasellar region normal. Midline structures intact. Vascular: Major intracranial vascular flow voids are maintained. Skull and upper cervical spine: Craniocervical junction within normal limits. Bone marrow signal intensity normal. No scalp soft tissue abnormality. Sinuses/Orbits: Globes and orbital soft tissues within normal limits. Paranasal sinuses are clear. No  mastoid effusion. Inner ear structures within normal limits. Other: None. IMPRESSION: 1. 1.2 cm acute ischemic nonhemorrhagic infarct involving the left paramedian pons. This likely accounts for the patient's symptoms given location. 2. Additional 6 mm acute ischemic nonhemorrhagic  infarct at the posterior left lentiform nucleus. 3. Underlying age-related cerebral atrophy with chronic microvascular ischemic disease, with a few additional remote lacunar infarcts about the deep gray nuclei. Overall, appearance is advanced for age. Electronically Signed   By: Jeannine Boga M.D.   On: 03/29/2021 23:49   ECHOCARDIOGRAM COMPLETE  Result Date: 03/30/2021    ECHOCARDIOGRAM REPORT   Patient Name:   Woodfin Lenna Sciara [facility] Date of Exam: 03/30/2021 Medical Rec #:  [id number]       Height:       63.0 in Accession #:    [id number]      Weight:       224.6 lb Date of Birth:  10-Jul-1981       BSA:          2.032 m Patient Age:    74 years        BP:           132/118 mmHg Patient Gender: M               HR:           75 bpm. Exam Location:  [facility] Procedure: 2D Echo and Strain Analysis Indications:     Stroke I63.9  History:         Patient has no prior history of Echocardiogram examinations.  Sonographer:     Kathlen Brunswick RDCS Referring Phys:  [id number] [place] Diagnosing Phys: Ida Rogue MD  Sonographer Comments: No subcostal window. Global longitudinal strain was attempted. IMPRESSIONS  1. Left ventricular ejection fraction, by estimation, is 60 to 65%. The left ventricle has normal function. The left ventricle has no regional wall motion abnormalities. There is moderate left ventricular hypertrophy. Left ventricular diastolic parameters are consistent with Grade I diastolic dysfunction (impaired relaxation). The average left ventricular global longitudinal strain is -17.3 %. The global longitudinal strain is normal.  2. Right ventricular systolic function is normal. The right ventricular size is normal. Tricuspid regurgitation signal is inadequate for assessing PA pressure.  3. The mitral valve is normal in structure. Mild mitral valve regurgitation. FINDINGS  Left Ventricle: Left ventricular ejection fraction, by estimation, is 60 to 65%. The left ventricle has normal function. The left  ventricle has no regional wall motion abnormalities. The average left ventricular global longitudinal strain is -17.3 %. The global longitudinal strain is normal. The left ventricular internal cavity size was normal in size. There is moderate left ventricular hypertrophy. Left ventricular diastolic parameters are consistent with Grade I diastolic dysfunction (impaired relaxation). Right Ventricle: The right ventricular size is normal. No increase in right ventricular wall thickness. Right ventricular systolic function is normal. Tricuspid regurgitation signal is inadequate for assessing PA pressure. Left Atrium: Left atrial size was normal in size. Right Atrium: Right atrial size was normal in size. Pericardium: There is no evidence of pericardial effusion. Mitral Valve: The mitral valve is normal in structure. Mild mitral valve regurgitation. No evidence of mitral valve stenosis. Tricuspid Valve: The tricuspid valve is normal in structure. Tricuspid valve regurgitation is not demonstrated. No evidence of tricuspid stenosis. Aortic Valve: The aortic valve is normal in structure. Aortic valve regurgitation is not visualized. No aortic stenosis is present. Aortic valve peak gradient measures 12.2 mmHg. Pulmonic Valve:  The pulmonic valve was normal in structure. Pulmonic valve regurgitation is not visualized. No evidence of pulmonic stenosis. Aorta: The aortic root is normal in size and structure. Venous: The inferior vena cava is normal in size with greater than 50% respiratory variability, suggesting right atrial pressure of 3 mmHg. IAS/Shunts: No atrial level shunt detected by color flow Doppler.  LEFT VENTRICLE PLAX 2D LVIDd:         4.71 cm  Diastology LVIDs:         3.12 cm  LV e' medial:    8.59 cm/s LV PW:         1.51 cm  LV E/e' medial:  8.3 LV IVS:        1.60 cm  LV e' lateral:   9.03 cm/s LVOT diam:     2.20 cm  LV E/e' lateral: 7.9 LV SV:         74 LV SV Index:   36       2D Longitudinal Strain LVOT  Area:     3.80 cm 2D Strain GLS Avg:     -17.3 %  RIGHT VENTRICLE RV Basal diam:  2.53 cm RV S prime:     16.30 cm/s TAPSE (M-mode): 2.8 cm LEFT ATRIUM             Index       RIGHT ATRIUM           Index LA diam:        3.50 cm 1.72 cm/m  RA Area:     11.10 cm LA Vol (A2C):   21.8 ml 10.73 ml/m RA Volume:   22.50 ml  11.08 ml/m LA Vol (A4C):   18.9 ml 9.30 ml/m LA Biplane Vol: 22.4 ml 11.03 ml/m  AORTIC VALVE                PULMONIC VALVE AV Area (Vmax): 2.39 cm    PV Vmax:       1.29 m/s AV Vmax:        175.00 cm/s PV Peak grad:  6.7 mmHg AV Peak Grad:   12.2 mmHg LVOT Vmax:      110.00 cm/s LVOT Vmean:     81.200 cm/s LVOT VTI:       0.195 m                             PULMONARY ARTERY AORTA                       MPA diam:        1.90 cm Ao Root diam: 3.00 cm Ao Asc diam:  2.60 cm MITRAL VALVE               TRICUSPID VALVE MV Area (PHT): 3.05 cm    TV Peak grad:   26.0 mmHg MV Decel Time: 249 msec    TV Vmax:        2.55 m/s MV E velocity: 71.60 cm/s MV A velocity: 66.40 cm/s  SHUNTS MV E/A ratio:  1.08        Systemic VTI:  0.20 m                            Systemic Diam: 2.20 cm Ida Rogue MD Electronically signed by Ida Rogue MD Signature Date/Time: 03/30/2021/1:03:10 PM    Final  CT HEAD CODE STROKE WO CONTRAST  Result Date: 03/29/2021 CLINICAL DATA:  Code stroke. Initial evaluation for acute blurry vision. EXAM: CT HEAD WITHOUT CONTRAST TECHNIQUE: Contiguous axial images were obtained from the base of the skull through the vertex without intravenous contrast. COMPARISON:  None. FINDINGS: Brain: Mildly advanced cerebral volume loss for age. Few scatter remote lacunar infarcts noted about the bilateral basal ganglia and right thalamus. Patchy hypodensity seen involving the supratentorial cerebral white matter, nonspecific, but most likely related chronic microvascular ischemic disease given the presence of the lacunar infarcts. No acute intracranial hemorrhage. No acute large vessel  territory infarct. No mass lesion or midline shift. No hydrocephalus or extra-axial fluid collection. Vascular: No hyperdense vessel. Skull: Scalp soft tissues and calvarium within normal limits. Sinuses/Orbits: Conjugate right gaze noted. Globes and orbital soft tissues demonstrate no other acute finding. Paranasal sinuses are clear. No mastoid effusion. Other: None. ASPECTS [facility] Stroke Program Early CT Score) - Ganglionic level infarction (caudate, lentiform nuclei, internal capsule, insula, M1-M3 cortex): 7 - Supraganglionic infarction (M4-M6 cortex): 3 Total score (0-10 with 10 being normal): 10 IMPRESSION: 1. No acute intracranial infarct or other abnormality. 2. ASPECTS is 10. 3. Multiple remote lacunar infarcts about the deep gray nuclei, with probable chronic microvascular ischemic disease involving the supratentorial cerebral white matter, advanced for age. Critical Value/emergent results were called by telephone at the time of interpretation on 03/29/2021 at 10:02 pm to provider MARK QUALE , who verbally acknowledged these results. Electronically Signed   By: Jeannine Boga M.D.   On: 03/29/2021 22:06    Microbiology: Recent Results (from the past 240 hour(s))  Resp Panel by RT-PCR (Flu A&B, Covid) Nasopharyngeal Swab     Status: None   Collection Time: 03/30/21  1:00 AM   Specimen: Nasopharyngeal Swab; Nasopharyngeal(NP) swabs in vial transport medium  Result Value Ref Range Status   SARS Coronavirus 2 by RT PCR NEGATIVE NEGATIVE Final    Comment: (NOTE) SARS-CoV-2 target nucleic acids are NOT DETECTED.  The SARS-CoV-2 RNA is generally detectable in upper respiratory specimens during the acute phase of infection. The lowest concentration of SARS-CoV-2 viral copies this assay can detect is 138 copies/mL. A negative result does not preclude SARS-Cov-2 infection and should not be used as the sole basis for treatment or other patient management decisions. A negative result may occur  with  improper specimen collection/handling, submission of specimen other than nasopharyngeal swab, presence of viral mutation(s) within the areas targeted by this assay, and inadequate number of viral copies(<138 copies/mL). A negative result must be combined with clinical observations, patient history, and epidemiological information. The expected result is Negative.  Fact Sheet for Patients:  EntrepreneurPulse.com.au  Fact Sheet for Healthcare Providers:  IncredibleEmployment.be  This test is no t yet approved or cleared by the [place] FDA and  has been authorized for detection and/or diagnosis of SARS-CoV-2 by FDA under an Emergency Use Authorization (EUA). This EUA will remain  in effect (meaning this test can be used) for the duration of the COVID-19 declaration under Section 564(b)(1) of the Act, 21 U.S.C.section 360bbb-3(b)(1), unless the authorization is terminated  or revoked sooner.       Influenza A by PCR NEGATIVE NEGATIVE Final   Influenza B by PCR NEGATIVE NEGATIVE Final    Comment: (NOTE) The Xpert Xpress SARS-CoV-2/FLU/RSV plus assay is intended as an aid in the diagnosis of influenza from Nasopharyngeal swab specimens and should not be used as a sole basis for treatment. Nasal washings  and aspirates are unacceptable for Xpert Xpress SARS-CoV-2/FLU/RSV testing.  Fact Sheet for Patients: EntrepreneurPulse.com.au  Fact Sheet for Healthcare Providers: IncredibleEmployment.be  This test is not yet approved or cleared by the [place] FDA and has been authorized for detection and/or diagnosis of SARS-CoV-2 by FDA under an Emergency Use Authorization (EUA). This EUA will remain in effect (meaning this test can be used) for the duration of the COVID-19 declaration under Section 564(b)(1) of the Act, 21 U.S.C. section 360bbb-3(b)(1), unless the authorization is terminated  or revoked.  Performed at [facility], [address] [place], [place] [address]   CULTURE, BLOOD (ROUTINE X 2) w Reflex to ID Panel     Status: None (Preliminary result)   Collection Time: 03/31/21  7:13 AM   Specimen: BLOOD  Result Value Ref Range Status   Specimen Description BLOOD RIGHT [facility]  Final   Special Requests   Final    BOTTLES DRAWN AEROBIC AND ANAEROBIC Blood Culture adequate volume   Culture   Final    NO GROWTH 1 DAY Performed at [facility], [address] [place], [place] [address]    Report Status PENDING  Incomplete  CULTURE, BLOOD (ROUTINE X 2) w Reflex to ID Panel     Status: None (Preliminary result)   Collection Time: 03/31/21  7:13 AM   Specimen: BLOOD  Result Value Ref Range Status   Specimen Description BLOOD RIGHT HAND  Final   Special Requests   Final    BOTTLES DRAWN AEROBIC AND ANAEROBIC Blood Culture adequate volume   Culture   Final    NO GROWTH 1 DAY Performed at [facility], [address], [place], [place] [address]    Report Status PENDING  Incomplete     Labs: Basic Metabolic Panel: Recent Labs  Lab 03/29/21 2156 03/30/21 0524 03/30/21 1453 03/31/21 0449  NA 137 136  --  137  K 3.2* 2.6*  --  3.5  CL 103 109  --  106  CO2 22 17*  --  19*  GLUCOSE 95 108*  --  112*  BUN 25* 26*  --  21*  CREATININE 2.30* 2.15*  --  1.93*  CALCIUM 10.1 9.4  --  9.8  MG  --   --  1.9  --    Liver Function Tests: Recent Labs  Lab 03/29/21 2156  AST 37  ALT 59*  ALKPHOS 98  BILITOT 1.1  PROT 8.5*  ALBUMIN 4.6   No results for input(s): LIPASE, AMYLASE in the last 168 hours. No results for input(s): AMMONIA in the last 168 hours. CBC: Recent Labs  Lab 03/29/21 2156 03/30/21 0524 03/31/21 0712 04/01/21 1112  WBC 11.1* 10.8* 21.8* 14.5*  NEUTROABS 6.8  --  19.2* 11.0*  HGB 14.6 13.8 14.2 13.9  HCT 43.9 39.4 40.5 41.8  MCV 81.3 78.8* 79.3* 81.2  PLT 386 366 393 359    CBG: Recent Labs  Lab  03/29/21 2134 03/29/21 2151  GLUCAP 143* 111*       Signed:  Oswald Hillock MD.  [facility] 04/01/2021, 1:14 PM

## 2021-04-01 NOTE — TOC Initial Note (Signed)
Transition of Care [facility]) - Initial/Assessment Note    Patient Details  Name: Jeremy Hudson MRN: [id number] Date of Birth: 1981/10/19  Transition of Care [facility]) CM/SW Contact:    Shelbie Hutching, RN Phone Number: 04/01/2021, 12:57 PM  Clinical Narrative:                 Patient admitted to the hospital with acute CVA.  RNCM met with patient at the bedside, mother, Clarene Critchley, is also present.  Patient has questions about applying for Medicaid or disability.  RNCM has reached out to the financial counseling department for assistance with Medicaid.  RNCM instructed patient to follow up with his PCP at [facility] to discuss disability.  RNCM provided patient with information on [place] financial assistance programs. Patient lives with 4 children one of them 15 and helps with the other 3.  Patient is also caring for his father in the home.  He works in [employer]. PT has recommended OP PT and patient agrees.  PT referral faxed to [facility] therapy department.     Expected Discharge Plan: OP Rehab Barriers to Discharge: No Barriers Identified   Patient Goals and CMS Choice Patient states their goals for this hospitalization and ongoing recovery are:: Patient needs information about applying for Medicaid and or disability      Expected Discharge Plan and Services Expected Discharge Plan: OP Rehab   Discharge Planning Services: CM Consult,Other - See comment                     DME Arranged: N/A DME Agency: NA       HH Arranged: NA          Prior Living Arrangements/Services   Lives with:: Minor Children,Parents Patient language and need for interpreter reviewed:: Yes Do you feel safe going back to the place where you live?: Yes      Need for Family Participation in Patient Care: Yes (Comment) (stroke) Care giver support system in place?: Yes (comment) (mother and adult daughter)   Criminal Activity/Legal Involvement Pertinent to Current Situation/Hospitalization: No -  Comment as needed  Activities of Daily Living Home Assistive Devices/Equipment: Blood pressure cuff ADL Screening (condition at time of admission) Patient's cognitive ability adequate to safely complete daily activities?: Yes Is the patient deaf or have difficulty hearing?: No Does the patient have difficulty seeing, even when wearing glasses/contacts?: No Does the patient have difficulty concentrating, remembering, or making decisions?: No Patient able to express need for assistance with ADLs?: Yes Does the patient have difficulty dressing or bathing?: No Independently performs ADLs?: Yes (appropriate for developmental age) Does the patient have difficulty walking or climbing stairs?: No Weakness of Legs: Right Weakness of Arms/Hands: Right  Permission Sought/Granted Permission sought to share information with : [place] granted to share information with : Yes, Verbal Permission Granted  Share Information with NAME: Clarene Critchley  Permission granted to share info w AGENCY: [facility] OP Therapy Dept  Permission granted to share info w Relationship: mother     Emotional Assessment Appearance:: Appears stated age Attitude/Demeanor/Rapport: Engaged Affect (typically observed): Accepting Orientation: : Oriented to Self,Oriented to  Time,Oriented to Situation,Oriented to Place Alcohol / Substance Use: Not Applicable Psych Involvement: No (comment)  Admission diagnosis:  Acute CVA (cerebrovascular accident) ([place]) [I63.9] Hypertension, unspecified type [I10] CVA (cerebral vascular accident) [facility]) [I63.9] Patient Active Problem List   Diagnosis Date Noted  . CVA (cerebral vascular accident) ([place]) 03/31/2021  .  Acute CVA (cerebrovascular accident) ([place]) 03/30/2021  . CKD (chronic kidney disease), stage III ([place])   . Kidney stone 02/21/2018  . Hypertension 02/21/2018  . AKI (acute kidney injury) ([place]) 02/21/2018  . Intractable  nausea and vomiting 02/21/2018   PCP:  [facility] [place]:   [place], [place] - [address] [place] [address] Phone: [phone] Fax: [phone]     Social Determinants of Health (SDOH) Interventions    Readmission Risk Interventions No flowsheet data found.

## 2021-04-01 NOTE — Plan of Care (Signed)
No acute events overnight. Received x1 dose PRN tylenol and hydralazine for elevated temp and BP >160. No c/o pain or SOB overnight. HR improved from 110s to 70s NSR on telemetry. Independent within room, adequate UO. Miralax given to aid with bowel movement. Safety precautions in place, rounding performed, needs/concerns addressed.  Problem: Education: Goal: Knowledge of General Education information will improve Description: Including pain rating scale, medication(s)/side effects and non-pharmacologic comfort measures Outcome: Progressing   Problem: Health Behavior/Discharge Planning: Goal: Ability to manage health-related needs will improve Outcome: Progressing   Problem: Clinical Measurements: Goal: Ability to maintain clinical measurements within normal limits will improve Outcome: Progressing Goal: Will remain free from infection Outcome: Progressing Goal: Diagnostic test results will improve Outcome: Progressing Goal: Respiratory complications will improve Outcome: Progressing Goal: Cardiovascular complication will be avoided Outcome: Progressing   Problem: Safety: Goal: Ability to remain free from injury will improve Outcome: Progressing   Problem: Education: Goal: Knowledge of disease or condition will improve Outcome: Progressing Goal: Knowledge of secondary prevention will improve Outcome: Progressing Goal: Knowledge of patient specific risk factors addressed and post discharge goals established will improve Outcome: Progressing Goal: Individualized Educational Video(s) Outcome: Progressing

## 2021-04-01 NOTE — Progress Notes (Signed)
Physical Therapy Treatment Patient Details Name: Jeremy Hudson MRN: [id number] DOB: 1981/01/13 Today's Date: 04/01/2021    History of Present Illness Pt is a 40 y.o. male presenting to hospital 5/27 with L facial droop and R eye blurry vision; numbness R lower leg and some difficulty walking d/t feeling off balance.  Pt admitted with acute CVA (MRI brain showed acute ischemic nonhemorrhagic infarct involving the left paramedian pons and posterior left lentiform nucleus).  PMH includes htn, AKI.    PT Comments    Patient with improved ability to participate with session this date; has been mobilizing in room indep without difficulty.  Does endorse some persistent blurred vision, but improved since admission. Gait performance significant for reciprocal stepping pattern with good step height/length; maintains balance and midline with dynamic gait components.  Good cadence, gait speed; feels gait peformance is at/near baseline for him. Higher level balance deficits noted (mostly with activities involving periods of SLS); patient with good awareness, good use of compensatory strategies as needed. Given above-noted performance, and completed mobility assessment, discharge recommendations updated to reflect home with outpatient services as medically appropriate.  Also, given high-level of mobility, recommend change in frequency from QD to 2x/week. TOC informed/aware of above.    Follow Up Recommendations  Outpatient PT     Equipment Recommendations       Recommendations for Other Services       Precautions / Restrictions Precautions Precautions: Fall Restrictions Weight Bearing Restrictions: No    Mobility  Bed Mobility Overal bed mobility: Independent                  Transfers Overall transfer level: Modified independent Equipment used: None             General transfer comment: good LE Strength/power with movement transitions  Ambulation/Gait Ambulation/Gait  assistance: Supervision;Modified independent (Device/Increase time) Gait Distance (Feet): 200 Feet Assistive device: None   Gait velocity: 10' walk time, 6 seconds   General Gait Details: reciprocal stepping pattern with good step height/length; maintains balance and midline with dynamic gait components.  Good cadence, gait speed; feels gait peformance is at/near baseline for him.   Stairs             Wheelchair Mobility    Modified Rankin (Stroke Patients Only)       Balance Overall balance assessment: Needs assistance Sitting-balance support: No upper extremity supported;Feet supported Sitting balance-Leahy Scale: Normal     Standing balance support: No upper extremity supported;During functional activity Standing balance-Leahy Scale: Good                   Standardized Balance Assessment Standardized Balance Assessment : Berg Balance Test Berg Balance Test Sit to Stand: Able to stand without using hands and stabilize independently Standing Unsupported: Able to stand safely 2 minutes Sitting with Back Unsupported but Feet Supported on Floor or Stool: Able to sit safely and securely 2 minutes Stand to Sit: Sits safely with minimal use of hands Transfers: Able to transfer safely, minor use of hands Standing Unsupported with Eyes Closed: Able to stand 10 seconds safely Standing Ubsupported with Feet Together: Able to place feet together independently and stand 1 minute safely From Standing, Reach Forward with Outstretched Arm: Can reach confidently >25 cm (10") From Standing Position, Pick up Object from Floor: Able to pick up shoe safely and easily From Standing Position, Turn to Look Behind Over each Shoulder: Looks behind from both sides and weight shifts well Turn  360 Degrees: Able to turn 360 degrees safely in 4 seconds or less Standing Unsupported, Alternately Place Feet on Step/Stool: Able to stand independently and safely and complete 8 steps in 20  seconds Standing Unsupported, One Foot in Front: Able to place foot tandem independently and hold 30 seconds Standing on One Leg: Able to lift leg independently and hold equal to or more than 3 seconds Total Score: 54        Cognition Arousal/Alertness: Awake/alert Behavior During Therapy: WFL for tasks assessed/performed Overall Cognitive Status: Within Functional Limits for tasks assessed                                        Exercises      General Comments        Pertinent Vitals/Pain Pain Assessment: No/denies pain    Home Living                      Prior Function            PT Goals (current goals can now be found in the care plan section) Acute Rehab PT Goals Patient Stated Goal: to go home PT Goal Formulation: With patient Time For Goal Achievement: 04/14/21 Potential to Achieve Goals: Good Progress towards PT goals: Progressing toward goals    Frequency    Min 2X/week      PT Plan Discharge plan needs to be updated;Frequency needs to be updated    Co-evaluation              AM-PAC PT "6 Clicks" Mobility   Outcome Measure  Help needed turning from your back to your side while in a flat bed without using bedrails?: None Help needed moving from lying on your back to sitting on the side of a flat bed without using bedrails?: None Help needed moving to and from a bed to a chair (including a wheelchair)?: None Help needed standing up from a chair using your arms (e.g., wheelchair or bedside chair)?: None Help needed to walk in hospital room?: None Help needed climbing 3-5 steps with a railing? : A Little 6 Click Score: 23    End of Session   Activity Tolerance: Patient tolerated treatment well Patient left:  (in bathroom per patient request)   PT Visit Diagnosis: Unsteadiness on feet (R26.81);Other abnormalities of gait and mobility (R26.89)     Time: 1008-1020 PT Time Calculation (min) (ACUTE ONLY): 12  min  Charges:  $Gait Training: 8-22 mins                    Aras Albarran H. Manson Passey, PT, DPT, NCS 04/01/21, 10:35 AM [phone]

## 2021-04-01 NOTE — Discharge Instructions (Signed)
Use eye drops to keep eye moist. Wear eye patch at during sleep to keep eye moist.

## 2021-04-02 LAB — HEMOGLOBIN A1C
Hgb A1c MFr Bld: 5.7 % — ABNORMAL HIGH (ref 4.8–5.6)
Mean Plasma Glucose: 117 mg/dL

## 2021-04-05 LAB — CULTURE, BLOOD (ROUTINE X 2)
Culture: NO GROWTH
Culture: NO GROWTH
Special Requests: ADEQUATE
Special Requests: ADEQUATE

## 2021-05-31 ENCOUNTER — Ambulatory Visit: Payer: Self-pay

## 2021-11-21 DIAGNOSIS — I10 Essential (primary) hypertension: Secondary | ICD-10-CM | POA: Diagnosis not present

## 2021-11-21 DIAGNOSIS — N183 Chronic kidney disease, stage 3 unspecified: Secondary | ICD-10-CM | POA: Diagnosis not present

## 2022-02-06 DIAGNOSIS — I10 Essential (primary) hypertension: Secondary | ICD-10-CM | POA: Diagnosis not present

## 2022-02-06 DIAGNOSIS — N183 Chronic kidney disease, stage 3 unspecified: Secondary | ICD-10-CM | POA: Diagnosis not present

## 2022-11-19 DIAGNOSIS — I129 Hypertensive chronic kidney disease with stage 1 through stage 4 chronic kidney disease, or unspecified chronic kidney disease: Secondary | ICD-10-CM | POA: Diagnosis not present

## 2022-11-19 DIAGNOSIS — N183 Chronic kidney disease, stage 3 unspecified: Secondary | ICD-10-CM | POA: Diagnosis not present

## 2023-03-03 ENCOUNTER — Telehealth: Payer: Self-pay

## 2023-03-03 NOTE — Telephone Encounter (Signed)
LVM for patient to call back. AS, CMA 

## 2023-05-18 DIAGNOSIS — I10 Essential (primary) hypertension: Secondary | ICD-10-CM | POA: Diagnosis not present

## 2023-05-18 DIAGNOSIS — Z8673 Personal history of transient ischemic attack (TIA), and cerebral infarction without residual deficits: Secondary | ICD-10-CM | POA: Diagnosis not present

## 2023-05-18 DIAGNOSIS — N183 Chronic kidney disease, stage 3 unspecified: Secondary | ICD-10-CM | POA: Diagnosis not present

## 2023-07-09 DIAGNOSIS — Z0130 Encounter for examination of blood pressure without abnormal findings: Secondary | ICD-10-CM | POA: Diagnosis not present

## 2023-07-09 DIAGNOSIS — F419 Anxiety disorder, unspecified: Secondary | ICD-10-CM | POA: Diagnosis not present

## 2023-07-09 DIAGNOSIS — Z1389 Encounter for screening for other disorder: Secondary | ICD-10-CM | POA: Diagnosis not present

## 2023-07-09 DIAGNOSIS — Z1159 Encounter for screening for other viral diseases: Secondary | ICD-10-CM | POA: Diagnosis not present

## 2023-07-09 DIAGNOSIS — N182 Chronic kidney disease, stage 2 (mild): Secondary | ICD-10-CM | POA: Diagnosis not present

## 2023-07-09 DIAGNOSIS — Z1331 Encounter for screening for depression: Secondary | ICD-10-CM | POA: Diagnosis not present

## 2023-07-09 DIAGNOSIS — Z0131 Encounter for examination of blood pressure with abnormal findings: Secondary | ICD-10-CM | POA: Diagnosis not present

## 2023-07-09 DIAGNOSIS — E559 Vitamin D deficiency, unspecified: Secondary | ICD-10-CM | POA: Diagnosis not present

## 2023-07-09 DIAGNOSIS — I10 Essential (primary) hypertension: Secondary | ICD-10-CM | POA: Diagnosis not present

## 2024-03-22 DIAGNOSIS — I10 Essential (primary) hypertension: Secondary | ICD-10-CM | POA: Diagnosis not present

## 2024-05-16 DIAGNOSIS — I10 Essential (primary) hypertension: Secondary | ICD-10-CM | POA: Diagnosis not present

## 2024-05-16 DIAGNOSIS — R7989 Other specified abnormal findings of blood chemistry: Secondary | ICD-10-CM | POA: Diagnosis not present

## 2024-05-16 DIAGNOSIS — N1832 Chronic kidney disease, stage 3b: Secondary | ICD-10-CM | POA: Diagnosis not present

## 2024-05-26 DIAGNOSIS — E785 Hyperlipidemia, unspecified: Secondary | ICD-10-CM | POA: Diagnosis not present

## 2024-05-26 DIAGNOSIS — R079 Chest pain, unspecified: Secondary | ICD-10-CM | POA: Diagnosis not present

## 2024-05-26 DIAGNOSIS — R7989 Other specified abnormal findings of blood chemistry: Secondary | ICD-10-CM | POA: Diagnosis not present

## 2024-05-26 DIAGNOSIS — N1831 Chronic kidney disease, stage 3a: Secondary | ICD-10-CM | POA: Diagnosis not present

## 2024-05-26 DIAGNOSIS — I1A0 Resistant hypertension: Secondary | ICD-10-CM | POA: Diagnosis not present

## 2024-06-06 DIAGNOSIS — N1831 Chronic kidney disease, stage 3a: Secondary | ICD-10-CM | POA: Diagnosis not present

## 2024-06-06 DIAGNOSIS — R0789 Other chest pain: Secondary | ICD-10-CM | POA: Diagnosis not present

## 2024-06-06 DIAGNOSIS — Z9189 Other specified personal risk factors, not elsewhere classified: Secondary | ICD-10-CM | POA: Diagnosis not present

## 2024-06-06 DIAGNOSIS — I1A0 Resistant hypertension: Secondary | ICD-10-CM | POA: Diagnosis not present

## 2024-06-06 DIAGNOSIS — E785 Hyperlipidemia, unspecified: Secondary | ICD-10-CM | POA: Diagnosis not present

## 2024-06-21 DIAGNOSIS — R0601 Orthopnea: Secondary | ICD-10-CM | POA: Diagnosis not present

## 2024-06-21 DIAGNOSIS — N183 Chronic kidney disease, stage 3 unspecified: Secondary | ICD-10-CM | POA: Diagnosis not present

## 2024-06-21 DIAGNOSIS — Z8673 Personal history of transient ischemic attack (TIA), and cerebral infarction without residual deficits: Secondary | ICD-10-CM | POA: Diagnosis not present

## 2024-06-21 DIAGNOSIS — I10 Essential (primary) hypertension: Secondary | ICD-10-CM | POA: Diagnosis not present

## 2024-06-21 DIAGNOSIS — I1A0 Resistant hypertension: Secondary | ICD-10-CM | POA: Diagnosis not present

## 2024-06-21 DIAGNOSIS — R0609 Other forms of dyspnea: Secondary | ICD-10-CM | POA: Diagnosis not present

## 2024-06-21 DIAGNOSIS — R9431 Abnormal electrocardiogram [ECG] [EKG]: Secondary | ICD-10-CM | POA: Diagnosis not present

## 2024-07-08 DIAGNOSIS — Z20822 Contact with and (suspected) exposure to covid-19: Secondary | ICD-10-CM | POA: Diagnosis not present

## 2024-07-08 DIAGNOSIS — I10 Essential (primary) hypertension: Secondary | ICD-10-CM | POA: Diagnosis not present

## 2024-07-08 DIAGNOSIS — R079 Chest pain, unspecified: Secondary | ICD-10-CM | POA: Diagnosis not present

## 2024-09-11 DIAGNOSIS — A084 Viral intestinal infection, unspecified: Secondary | ICD-10-CM | POA: Diagnosis not present

## 2024-09-11 DIAGNOSIS — R109 Unspecified abdominal pain: Secondary | ICD-10-CM | POA: Diagnosis not present
